# Patient Record
Sex: Female | Born: 1950 | Race: White | Hispanic: No | Smoking: Never smoker
Health system: Southern US, Community
[De-identification: ages and names within clinical notes are randomized; demographics above are authoritative.]

## PROBLEM LIST (undated history)

## (undated) DIAGNOSIS — E119 Type 2 diabetes mellitus without complications: Secondary | ICD-10-CM

## (undated) DIAGNOSIS — I10 Essential (primary) hypertension: Secondary | ICD-10-CM

## (undated) DIAGNOSIS — G35 Multiple sclerosis: Secondary | ICD-10-CM

---

## 2016-05-10 ENCOUNTER — Ambulatory Visit: Payer: Self-pay

## 2016-11-21 ENCOUNTER — Other Ambulatory Visit: Payer: Self-pay | Admitting: Physician Assistant

## 2016-11-21 DIAGNOSIS — R221 Localized swelling, mass and lump, neck: Secondary | ICD-10-CM

## 2016-11-28 ENCOUNTER — Ambulatory Visit
Admission: RE | Admit: 2016-11-28 | Discharge: 2016-11-28 | Disposition: A | Discharge status: Home / Self Care | Payer: Medicare Other | Source: Ambulatory Visit | Attending: Physician Assistant | Admitting: Physician Assistant

## 2016-11-28 DIAGNOSIS — R221 Localized swelling, mass and lump, neck: Secondary | ICD-10-CM | POA: Diagnosis not present

## 2016-11-28 HISTORY — DX: Essential (primary) hypertension: I10

## 2016-11-28 HISTORY — DX: Type 2 diabetes mellitus without complications: E11.9

## 2016-11-28 LAB — POCT I-STAT CREATININE: Creatinine, Ser: 0.8 mg/dL (ref 0.44–1.00)

## 2016-11-28 MED ORDER — IOPAMIDOL (ISOVUE-300) INJECTION 61%
75.0000 mL | Freq: Once | INTRAVENOUS | Status: AC | PRN
  Administered 2016-11-28: 75 mL via INTRAVENOUS

## 2017-05-10 ENCOUNTER — Inpatient Hospital Stay
Admission: EM | Admit: 2017-05-10 | Discharge: 2017-05-12 | DRG: 064 | Disposition: A | Discharge status: Home / Self Care | Payer: Medicare Other | Attending: Internal Medicine | Admitting: Internal Medicine

## 2017-05-10 ENCOUNTER — Emergency Department: Payer: Medicare Other

## 2017-05-10 DIAGNOSIS — Z79899 Other long term (current) drug therapy: Secondary | ICD-10-CM | POA: Diagnosis not present

## 2017-05-10 DIAGNOSIS — L8990 Pressure ulcer of unspecified site, unspecified stage: Secondary | ICD-10-CM | POA: Diagnosis present

## 2017-05-10 DIAGNOSIS — G936 Cerebral edema: Secondary | ICD-10-CM | POA: Diagnosis present

## 2017-05-10 DIAGNOSIS — Z23 Encounter for immunization: Secondary | ICD-10-CM

## 2017-05-10 DIAGNOSIS — R51 Headache: Secondary | ICD-10-CM

## 2017-05-10 DIAGNOSIS — I639 Cerebral infarction, unspecified: Secondary | ICD-10-CM

## 2017-05-10 DIAGNOSIS — R29701 NIHSS score 1: Secondary | ICD-10-CM | POA: Diagnosis present

## 2017-05-10 DIAGNOSIS — I63411 Cerebral infarction due to embolism of right middle cerebral artery: Principal | ICD-10-CM | POA: Diagnosis present

## 2017-05-10 DIAGNOSIS — Z794 Long term (current) use of insulin: Secondary | ICD-10-CM

## 2017-05-10 DIAGNOSIS — E785 Hyperlipidemia, unspecified: Secondary | ICD-10-CM | POA: Diagnosis present

## 2017-05-10 DIAGNOSIS — R519 Headache, unspecified: Secondary | ICD-10-CM

## 2017-05-10 DIAGNOSIS — I10 Essential (primary) hypertension: Secondary | ICD-10-CM | POA: Diagnosis present

## 2017-05-10 DIAGNOSIS — G35 Multiple sclerosis: Secondary | ICD-10-CM | POA: Diagnosis present

## 2017-05-10 DIAGNOSIS — E119 Type 2 diabetes mellitus without complications: Secondary | ICD-10-CM | POA: Diagnosis present

## 2017-05-10 HISTORY — DX: Multiple sclerosis: G35

## 2017-05-10 LAB — CBC WITH DIFFERENTIAL/PLATELET
BASOS ABS: 0.1 10*3/uL (ref 0–0.1)
BASOS PCT: 1 %
Eosinophils Absolute: 0.2 10*3/uL (ref 0–0.7)
Eosinophils Relative: 2 %
HEMATOCRIT: 41.2 % (ref 35.0–47.0)
HEMOGLOBIN: 14 g/dL (ref 12.0–16.0)
Lymphocytes Relative: 26 %
Lymphs Abs: 2.3 10*3/uL (ref 1.0–3.6)
MCH: 30.2 pg (ref 26.0–34.0)
MCHC: 33.9 g/dL (ref 32.0–36.0)
MCV: 89.1 fL (ref 80.0–100.0)
MONOS PCT: 7 %
Monocytes Absolute: 0.6 10*3/uL (ref 0.2–0.9)
NEUTROS ABS: 5.8 10*3/uL (ref 1.4–6.5)
NEUTROS PCT: 64 %
Platelets: 275 10*3/uL (ref 150–440)
RBC: 4.62 MIL/uL (ref 3.80–5.20)
RDW: 14 % (ref 11.5–14.5)
WBC: 8.9 10*3/uL (ref 3.6–11.0)

## 2017-05-10 LAB — BASIC METABOLIC PANEL
ANION GAP: 10 (ref 5–15)
BUN: 14 mg/dL (ref 6–20)
CALCIUM: 9.1 mg/dL (ref 8.9–10.3)
CO2: 21 mmol/L — ABNORMAL LOW (ref 22–32)
CREATININE: 0.84 mg/dL (ref 0.44–1.00)
Chloride: 99 mmol/L — ABNORMAL LOW (ref 101–111)
Glucose, Bld: 290 mg/dL — ABNORMAL HIGH (ref 65–99)
Potassium: 4.1 mmol/L (ref 3.5–5.1)
SODIUM: 130 mmol/L — AB (ref 135–145)

## 2017-05-10 LAB — APTT: APTT: 31 s (ref 24–36)

## 2017-05-10 LAB — TROPONIN I

## 2017-05-10 LAB — PROTIME-INR
INR: 1.04
PROTHROMBIN TIME: 13.5 s (ref 11.4–15.2)

## 2017-05-10 MED ORDER — ASPIRIN 81 MG PO CHEW
324.0000 mg | CHEWABLE_TABLET | Freq: Once | ORAL | Status: AC
  Administered 2017-05-10: 324 mg via ORAL
  Filled 2017-05-10: qty 4

## 2017-05-10 MED ORDER — GADOBENATE DIMEGLUMINE 529 MG/ML IV SOLN
20.0000 mL | Freq: Once | INTRAVENOUS | Status: AC | PRN
  Administered 2017-05-10: 20 mL via INTRAVENOUS

## 2017-05-10 NOTE — ED Notes (Signed)
Patient transported to CT 

## 2017-05-10 NOTE — ED Notes (Signed)
Provided crackers for pt, per EDP approval.  Pt asking to speak with EDP regarding CT results.  EDP notified and acknowledged request.

## 2017-05-10 NOTE — ED Provider Notes (Addendum)
[facility] Emergency Department Provider Note  ____________________________________________  Time seen: Approximately 2:16 PM  I have reviewed the triage vital signs and the nursing notes.   HISTORY  Chief Complaint Headache    HPI Brittany Huffman is a 66 y.o. female who complains of a sudden onset severe right-sided headache described as a tightness that is nonradiating. No aggravating or alleviating factors. He tried Tylenol which usually controls her headaches without improvement. It's been constant for the past 3 days, but actually now on arrival to the ED seems to be improving. It's associated with a generalized altered sensation that she cannot describe. She just notes that when her body touches other things it feels different. Not lateralizing. No motor weakness. No vision changes. No vomiting or syncope. No trauma or recent illness.  11 years ago she had similar symptoms and was diagnosed with multiple sclerosis. She takes daily medications to control this disease process as well for hypertension and diabetes.     Past Medical History:  Diagnosis Date  . Diabetes mellitus without complication (HCC)    Pt takes insulin.  Brittany Huffman. Hypertension   . Multiple sclerosis (HCC)      There are no active problems to display for this patient.    History reviewed. No pertinent surgical history.   Prior to Admission medications   Medication Sig Start Date End Date Taking? Authorizing Provider  amLODipine (NORVASC) 10 MG tablet Take 1 tablet by mouth daily. 05/10/17  Yes [provider]  atorvastatin (LIPITOR) 40 MG tablet Take 1 tablet by mouth daily. 05/07/17  Yes [provider]  AUBAGIO 14 MG TABS Take 1 tablet by mouth daily. 04/25/17  Yes [provider]  lisinopril (PRINIVIL,ZESTRIL) 20 MG tablet Take 1 tablet by mouth daily. 05/07/17  Yes [provider]  NOVOLOG FLEXPEN 100 UNIT/ML FlexPen Inject 0-40 Units into the  skin daily. 02/12/17  Yes [provider]  PARoxetine (PAXIL) 40 MG tablet Take 1 tablet by mouth daily. 05/07/17  Yes [provider]  TOUJEO SOLOSTAR 300 UNIT/ML SOPN Inject 50 Units into the skin 2 (two) times daily. 02/10/17  Yes [provider]  TRULICITY 1.5 MG/0.5ML SOPN Inject 0.75 mLs into the skin daily. 04/20/17  Yes [provider]  WELCHOL 625 MG tablet Take 1 tablet by mouth daily. 05/09/17  Yes [provider]  Tylene Fantasiaaubagio ampyra Lisinopril    Allergies Patient has no known allergies.   No family history on file.  Social History Social History  Substance Use Topics  . Smoking status: Not on file  . Smokeless tobacco: Not on file  . Alcohol use Not on file  No tobacco or alcohol use  Review of Systems  Constitutional:   No fever or chills.  ENT:   No sore throat. No rhinorrhea. Cardiovascular:   No chest pain or syncope. Respiratory:   No dyspnea or cough. Gastrointestinal:   Negative for abdominal pain, vomiting and diarrhea.  Musculoskeletal:   Negative for focal pain or swelling All other systems reviewed and are negative except as documented above in ROS and HPI.  ____________________________________________   PHYSICAL EXAM:  VITAL SIGNS: ED Triage Vitals [05/10/17 1217]  Enc Vitals Group     BP (!) 151/64     Pulse Rate 83     Resp 18     Temp 98.4 F (36.9 C)     Temp Source Oral     SpO2 98 %     Weight 274  lb (124.3 kg)     Height 5\' 10"  (1.778 m)     Head Circumference      Peak Flow      Pain Score 8     Pain Loc      Pain Edu?      Excl. in GC?     Vital signs reviewed, nursing assessments reviewed.   Constitutional:   Alert and oriented. Well appearing and in no distress. Eyes:   No scleral icterus.  EOMI. No nystagmus. No conjunctival pallor. PERRL. ENT   Head:   Normocephalic and atraumatic.   Nose:   No congestion/rhinnorhea.    Mouth/Throat:   MMM, no pharyngeal erythema. No  peritonsillar mass.    Neck:   No meningismus. Full ROM. Hematological/Lymphatic/Immunilogical:   No cervical lymphadenopathy. Cardiovascular:   RRR. Symmetric bilateral radial and DP pulses.  No murmurs.  Respiratory:   Normal respiratory effort without tachypnea/retractions. Breath sounds are clear and equal bilaterally. No wheezes/rales/rhonchi. Gastrointestinal:   Soft and nontender. Non distended. There is no CVA tenderness.  No rebound, rigidity, or guarding. Genitourinary:   deferred Musculoskeletal:   Normal range of motion in all extremities. No joint effusions.  No lower extremity tenderness.  No edema. Neurologic:   Normal speech and language. Subjectively impaired memory Motor grossly intact. No gross focal neurologic deficits are appreciated.  Skin:    Skin is warm, dry and intact. No rash noted.  No petechiae, purpura, or bullae.  ____________________________________________    LABS (pertinent positives/negatives) (all labs ordered are listed, but only abnormal results are displayed) Labs Reviewed  BASIC METABOLIC PANEL - Abnormal; Notable for the following:       Result Value   Sodium 130 (*)    Chloride 99 (*)    CO2 21 (*)    Glucose, Bld 290 (*)    All other components within normal limits  CBC WITH DIFFERENTIAL/PLATELET  TROPONIN I   ____________________________________________   EKG  Interpreted by me Sinus rhythm rate of 83, left axis, normal intervals. Normal QRS ST segments and T waves.  ____________________________________________    RADIOLOGY  Ct Head Wo Contrast  Result Date: 05/10/2017 CLINICAL DATA:  Three-day history of right parietal region headache EXAM: CT HEAD WITHOUT CONTRAST TECHNIQUE: Contiguous axial images were obtained from the base of the skull through the vertex without intravenous contrast. COMPARISON:  None. FINDINGS: Brain: There is mild to moderate diffuse atrophy. There is decreased attenuation in the posterosuperior right  temporal lobe near the temporal occipital junction with extension of apparent edema into the posterior right lentiform nucleus. Slightly more superiorly, there is a similar focus of decreased attenuation felt to represent edema in the anterior right parietal lobe. This appearance is somewhat unusual for infarct, and the possibility underlying mass lesions with vasogenic edema must be of concern. Elsewhere, there is slight periventricular small vessel disease in the centra semiovale bilaterally. There is no hemorrhage, midline shift, or extra-axial fluid collection. Vascular: No hyperdense vessel. There is calcification in each carotid siphon region. Skull: The bony calvarium appears intact. Sinuses/Orbits: There is mucosal thickening in several ethmoid air cells bilaterally. Other visualized paranasal sinuses are clear. There is leftward deviation the nasal septum. Orbits appear symmetric bilaterally except for prior cataract removal on the right. Other: Mastoid air cells are clear. There is debris in each external auditory canal. IMPRESSION: 1. Decreased attenuation concerning for vasogenic edema in the superior posterior right temporal lobe extending into the posterior right lentiform nucleus. A  second similar focus of concerning edema is noted in the anterior right parietal lobe. While infarct could present in this manner, the appearance raises concern for potential mass lesions with edema. In this regard, would advise brain MRI pre and post-contrast to further assess. If patient is a contraindication to MRI, contrast enhanced brain CT would be an alternative to further evaluate. 2. No hemorrhage. No midline shift or extra-axial fluid collection. There is slight small vessel disease in the centra semiovale bilaterally. 3.  There are foci of arterial vascular calcification. 4. Mucosal thickening in ethmoid air cells. Deviated nasal septum toward the left. 5.  Probable cerumen in each external auditory canal.  Electronically Signed   By: Bretta Bang III M.D.   On: 05/10/2017 13:58    ____________________________________________   PROCEDURES Procedures  ____________________________________________   DIFFERENTIAL DIAGNOSIS  Cerebral aneurysm, intracranial hemorrhage, stroke, multiple sclerosis flare  CLINICAL IMPRESSION / ASSESSMENT AND PLAN / ED COURSE  Pertinent labs & imaging results that were available during my care of the patient were reviewed by me and considered in my medical decision making (see chart for details).   Patient presents with subjective symptoms but with a reported severe right-sided headache and altered sensation which reminds her of her initial presentation of multiple sclerosis 11 years ago. She's been compliant with her medications and due to lack of relapsing events has not had any recent neuro imaging in the past few years. We'll plan to obtain a CT scan of the head to evaluate for intracranial hemorrhage, followed up by MRI brain if the CT is negative. Patient understands and agrees with this plan.  Clinical Course as of May 10 1536  Fri May 10, 2017  1414 CT head not c/w ICH, but shows vasogenic edema. Will proceed with MRI for eval of MS vs mass lesion vs CVA. Labs unremarkable.   [PS]    Clinical Course User Index [PS] Sharman Cheek, MD    ----------------------------------------- 3:37 PM on 05/10/2017 -----------------------------------------  Patient updated on results of the CT. Care of patient signed out to Dr. Derrill Kay.  ____________________________________________   FINAL CLINICAL IMPRESSION(S) / ED DIAGNOSES    Final diagnoses:  Bad headache  Multiple sclerosis (HCC)  Vasogenic edema (HCC)      New Prescriptions   No medications on file     Portions of this note were generated with dragon dictation software. Dictation errors may occur despite best attempts at proofreading.    Sharman Cheek, MD 05/10/17 1420     Sharman Cheek, MD 05/10/17 1537

## 2017-05-10 NOTE — ED Triage Notes (Signed)
Pt c/o headache X 3 days, unable to get relief with tylenol. Pt also c/o left shoulder pain without injury. Pt alert and oriented X4, active, cooperative, pt in NAD. RR even and unlabored, color WNL.

## 2017-05-10 NOTE — ED Notes (Signed)
Pt on phone with MRI staff.

## 2017-05-10 NOTE — ED Notes (Signed)
Patient transported to MRI.AS 

## 2017-05-10 NOTE — H&P (Signed)
[facility] - [place] [facility]   PATIENT NAME: Brittany Huffman    MR#:  [id number]  DATE OF BIRTH:  1950-07-28  DATE OF ADMISSION:  05/10/2017  PRIMARY CARE PHYSICIAN: System, Pcp Not In   REQUESTING/REFERRING PHYSICIAN: Derrill Kay, MD  CHIEF COMPLAINT:   Chief Complaint  Patient presents with  . Headache    HISTORY OF PRESENT ILLNESS:  Brittany Huffman  is a 66 y.o. female who presents with 2-3 days of intermittent but increasing in intensity headaches.  She states that today she developed severe headache, and then also experienced some sensory abnormality.  She describes this as difficulty recognizing her perception of how objects felt.  She denies any out right numbness, but just states that "objects did not feel like they felt before."  Here in the ED MRI was performed and showed multifocal infarcts suspicious for embolic etiology.  Patient has no history of A. fib or other significant arrhythmia.  Hospitalist were called for admission and further evaluation  PAST MEDICAL HISTORY:   Past Medical History:  Diagnosis Date  . Diabetes mellitus without complication (HCC)    Pt takes insulin.  [name] Hypertension   . Multiple sclerosis (HCC)     PAST SURGICAL HISTORY:  History reviewed. No pertinent surgical history.  SOCIAL HISTORY:   Social History  Substance Use Topics  . Smoking status: Not on file  . Smokeless tobacco: Not on file  . Alcohol use Not on file    FAMILY HISTORY:  No family history on file.  DRUG ALLERGIES:  No Known Allergies  MEDICATIONS AT HOME:   Prior to Admission medications   Medication Sig Start Date End Date Taking? Authorizing Provider  amLODipine (NORVASC) 10 MG tablet Take 1 tablet by mouth daily. 05/10/17  Yes [provider]  atorvastatin (LIPITOR) 40 MG tablet Take 1 tablet by mouth daily. 05/07/17  Yes [provider]  AUBAGIO 14 MG TABS Take 1 tablet by mouth daily. 04/25/17  Yes [provider]  lisinopril (PRINIVIL,ZESTRIL) 20 MG tablet Take 1 tablet by mouth daily. 05/07/17  Yes [provider]  NOVOLOG FLEXPEN 100 UNIT/ML FlexPen Inject 0-40 Units into the skin daily. 02/12/17  Yes [provider]  PARoxetine (PAXIL) 40 MG tablet Take 1 tablet by mouth daily. 05/07/17  Yes [provider]  TOUJEO SOLOSTAR 300 UNIT/ML SOPN Inject 50 Units into the skin 2 (two) times daily. 02/10/17  Yes [provider]  TRULICITY 1.5 MG/0.5ML SOPN Inject 0.75 mLs into the skin daily. 04/20/17  Yes [provider]  WELCHOL 625 MG tablet Take 1 tablet by mouth daily. 05/09/17  Yes [provider]    REVIEW OF SYSTEMS:  Review of Systems  Constitutional: Negative for chills, fever, malaise/fatigue and weight loss.  HENT: Negative for ear pain, hearing loss and tinnitus.   Eyes: Negative for blurred vision, double vision, pain and redness.  Respiratory: Negative for cough, hemoptysis and shortness of breath.   Cardiovascular: Negative for chest pain, palpitations, orthopnea and leg swelling.  Gastrointestinal: Negative for abdominal pain, constipation, diarrhea, nausea and vomiting.  Genitourinary: Negative for dysuria, frequency and hematuria.  Musculoskeletal: Negative for back pain, joint pain and neck pain.  Skin:       No acne, rash, or lesions  Neurological: Positive for sensory change and headaches. Negative for dizziness, tremors, focal weakness and weakness.  Endo/Heme/Allergies: Negative for polydipsia. Does not bruise/bleed easily.  Psychiatric/Behavioral: Negative for depression. The patient is not  nervous/anxious and does not have insomnia.      VITAL SIGNS:   Vitals:   05/10/17 1830 05/10/17 1900 05/10/17 2100 05/10/17 2130  BP: (!) 126/54 139/70 127/63 134/63  Pulse: (!) 104 82    Resp: 14 16 17 19   Temp:      TempSrc:      SpO2: 96% 94%    Weight:      Height:       Wt Readings from Last 3 Encounters:   05/10/17 124.3 kg (274 lb)    PHYSICAL EXAMINATION:  Physical Exam  Vitals reviewed. Constitutional: She is oriented to person, place, and time. She appears well-developed and well-nourished. No distress.  HENT:  Head: Normocephalic and atraumatic.  Mouth/Throat: Oropharynx is clear and moist.  Eyes: Pupils are equal, round, and reactive to light. Conjunctivae and EOM are normal. No scleral icterus.  Neck: Normal range of motion. Neck supple. No JVD present. No thyromegaly present.  Cardiovascular: Normal rate, regular rhythm and intact distal pulses.  Exam reveals no gallop and no friction rub.   No murmur heard. Respiratory: Effort normal and breath sounds normal. No respiratory distress. She has no wheezes. She has no rales.  GI: Soft. Bowel sounds are normal. She exhibits no distension. There is no tenderness.  Musculoskeletal: Normal range of motion. She exhibits no edema.  No arthritis, no gout  Lymphadenopathy:    She has no cervical adenopathy.  Neurological: She is alert and oriented to person, place, and time. No cranial nerve deficit.  Neurologic: Cranial nerves II-XII intact, Sensation intact to light touch/pinprick, 5/5 strength in all extremities, no dysarthria, no aphasia, no dysphagia, memory intact, patient still has mild reported abnormality of abnormal perception of sensation (states that "things just do not feel like what they felt like before, like I have a hard time recognizing how objects felt before")  Skin: Skin is warm and dry. No rash noted. No erythema.  Psychiatric: She has a normal mood and affect. Her behavior is normal. Judgment and thought content normal.    LABORATORY PANEL:   CBC  Recent Labs Lab 05/10/17 1234  WBC 8.9  HGB 14.0  HCT 41.2  PLT 275   ------------------------------------------------------------------------------------------------------------------  Chemistries   Recent Labs Lab 05/10/17 1234  NA 130*  K 4.1  CL 99*   CO2 21*  GLUCOSE 290*  BUN 14  CREATININE 0.84  CALCIUM 9.1   ------------------------------------------------------------------------------------------------------------------  Cardiac Enzymes  Recent Labs Lab 05/10/17 1234  TROPONINI <0.03   ------------------------------------------------------------------------------------------------------------------  RADIOLOGY:  Ct Head Wo Contrast  Result Date: 05/10/2017 CLINICAL DATA:  Three-day history of right parietal region headache EXAM: CT HEAD WITHOUT CONTRAST TECHNIQUE: Contiguous axial images were obtained from the base of the skull through the vertex without intravenous contrast. COMPARISON:  None. FINDINGS: Brain: There is mild to moderate diffuse atrophy. There is decreased attenuation in the posterosuperior right temporal lobe near the temporal occipital junction with extension of apparent edema into the posterior right lentiform nucleus. Slightly more superiorly, there is a similar focus of decreased attenuation felt to represent edema in the anterior right parietal lobe. This appearance is somewhat unusual for infarct, and the possibility underlying mass lesions with vasogenic edema must be of concern. Elsewhere, there is slight periventricular small vessel disease in the centra semiovale bilaterally. There is no hemorrhage, midline shift, or extra-axial fluid collection. Vascular: No hyperdense vessel. There is calcification in each carotid siphon region. Skull: The bony calvarium appears intact. Sinuses/Orbits: There is mucosal  thickening in several ethmoid air cells bilaterally. Other visualized paranasal sinuses are clear. There is leftward deviation the nasal septum. Orbits appear symmetric bilaterally except for prior cataract removal on the right. Other: Mastoid air cells are clear. There is debris in each external auditory canal. IMPRESSION: 1. Decreased attenuation concerning for vasogenic edema in the superior posterior right  temporal lobe extending into the posterior right lentiform nucleus. A second similar focus of concerning edema is noted in the anterior right parietal lobe. While infarct could present in this manner, the appearance raises concern for potential mass lesions with edema. In this regard, would advise brain MRI pre and post-contrast to further assess. If patient is a contraindication to MRI, contrast enhanced brain CT would be an alternative to further evaluate. 2. No hemorrhage. No midline shift or extra-axial fluid collection. There is slight small vessel disease in the centra semiovale bilaterally. 3.  There are foci of arterial vascular calcification. 4. Mucosal thickening in ethmoid air cells. Deviated nasal septum toward the left. 5.  Probable cerumen in each external auditory canal. Electronically Signed   By: Bretta Bang III M.D.   On: 05/10/2017 13:58   Mr Angiogram Head Wo Contrast  Result Date: 05/10/2017 CLINICAL DATA:  Initial evaluation for sudden onset having, history of multiple sclerosis. EXAM: MRI HEAD WITHOUT AND WITH CONTRAST MRA HEAD WITHOUT CONTRAST TECHNIQUE: Multiplanar, multiecho pulse sequences of the brain and surrounding structures were obtained without and with intravenous contrast. Angiographic images of the head were obtained using MRA technique without contrast. CONTRAST:  65mL MULTIHANCE GADOBENATE DIMEGLUMINE 529 MG/ML IV SOLN COMPARISON:  Prior CT from earlier the same day. FINDINGS: MRI HEAD FINDINGS Brain: Moderate cerebral atrophy. Patchy T2/FLAIR hyperintensity within the periventricular and deep white matter both cerebral hemispheres, nonspecific, but could be related to provided history of multiple sclerosis. Few small remote left cerebellar infarcts noted. The Patchy and confluent restricted diffusion seen involving the posterior right insula, extending into the posterior right frontoparietal region as well as the right occipital lobe, consistent with acute right  posterior MCA territory infarct. Involvement of the cortical gray matter as well as the underlying subcortical, deep, and periventricular white matter. Overall infarct volume is moderate in size. No associated hemorrhage or significant mass effect. Additional patchy small volume ischemic infarcts measuring approximately 15 mm present within the superior left cerebellar hemisphere (series 100, image 15). No associated hemorrhage or mass effect. No other convincing evidence for acute or subacute ischemia. Gray-white matter differentiation otherwise maintained. No evidence for acute or chronic intracranial hemorrhage. No mass lesion, midline shift or mass effect. Mild leptomeningeal enhancement related to the posterior right MCA territory infarct. No other abnormal enhancement. No findings to suggest active demyelination. No hydrocephalus. No extra-axial fluid collection. Major dural sinuses grossly patent. Incidental note made of a partially empty sella. Midline structures intact and normal. Vascular: Major intracranial vascular flow voids are maintained. Skull and upper cervical spine: Craniocervical junction normal. Upper cervical spine within normal limits. Bone marrow signal intensity normal. No scalp soft tissue abnormality. Sinuses/Orbits: Globes and orbital soft tissues within normal limits. Patient status post cataract extraction on the right. Other: Paranasal sinuses are largely clear. No significant mastoid effusion. MRA HEAD FINDINGS ANTERIOR CIRCULATION: Distal cervical segments patent of the skullbase with antegrade flow. Petrous, cavernous, and supraclinoid segments patent without flow-limiting stenosis. A1 segments patent bilaterally. Left A1 segment slightly hypoplastic, which likely accounts for the slightly diminutive left ICA is compared to the right. A1 segments and anterior  communicating artery patent and within normal limits. ACA see irregular bilaterally with moderate multifocal stenoses,  greater on the right. ACAs are patent to their distal aspects. M1 segments patent bilaterally without stenosis. Normal MCA bifurcations. Moderate multifocal atheromatous regularity with stenoses involving the right M2 segments and distal right MCA branches. No proximal M2 stenosis or occlusion seen on the left. MCA branches well perfused and fairly symmetric. POSTERIOR CIRCULATION: Vertebral arteries patent to the vertebrobasilar junction without stenosis. Left PICA patent proximally. Right PICA not visualized. Basilar artery patent to its distal aspect without stenosis. Patent ICAs. Both of the posterior cerebral arteries supplied primarily via the basilar. Small posterior communicating arteries noted bilaterally. Right PCA patent to its distal aspect without flow-limiting stenosis. Distal left P3 segment and distal small branches are attenuated, possibly reflecting severe stenosis. No aneurysm. IMPRESSION: MRI HEAD IMPRESSION: 1. Patchy multifocal moderate sized posterior right MCA territory infarcts, with additional small superior left cerebellar infarct. No associated hemorrhage or mass effect. Possible central thromboembolic etiology is suspected given the various vascular distributions involved. The 2. Underlying mild cerebral white matter changes, nonspecific, but could be consistent with provided history of multiple sclerosis. No evidence for active demyelination. 3. Mild age-related cerebral atrophy. No other acute intracranial abnormality. MRA HEAD IMPRESSION: 1. Negative intracranial MRA for large or proximal arterial branch occlusion. No correctable stenosis. 2. Moderate atheromatous irregularity involving the intracranial circulation, most notably within the anterior cerebral arteries, distal right MCA branches, and distal left PCA. Electronically Signed   By: Rise MuBenjamin  McClintock M.D.   On: 05/10/2017 21:06   Mr Brain W And Wo Contrast  Result Date: 05/10/2017 CLINICAL DATA:  Initial evaluation for  sudden onset having, history of multiple sclerosis. EXAM: MRI HEAD WITHOUT AND WITH CONTRAST MRA HEAD WITHOUT CONTRAST TECHNIQUE: Multiplanar, multiecho pulse sequences of the brain and surrounding structures were obtained without and with intravenous contrast. Angiographic images of the head were obtained using MRA technique without contrast. CONTRAST:  20mL MULTIHANCE GADOBENATE DIMEGLUMINE 529 MG/ML IV SOLN COMPARISON:  Prior CT from earlier the same day. FINDINGS: MRI HEAD FINDINGS Brain: Moderate cerebral atrophy. Patchy T2/FLAIR hyperintensity within the periventricular and deep white matter both cerebral hemispheres, nonspecific, but could be related to provided history of multiple sclerosis. Few small remote left cerebellar infarcts noted. The Patchy and confluent restricted diffusion seen involving the posterior right insula, extending into the posterior right frontoparietal region as well as the right occipital lobe, consistent with acute right posterior MCA territory infarct. Involvement of the cortical gray matter as well as the underlying subcortical, deep, and periventricular white matter. Overall infarct volume is moderate in size. No associated hemorrhage or significant mass effect. Additional patchy small volume ischemic infarcts measuring approximately 15 mm present within the superior left cerebellar hemisphere (series 100, image 15). No associated hemorrhage or mass effect. No other convincing evidence for acute or subacute ischemia. Gray-white matter differentiation otherwise maintained. No evidence for acute or chronic intracranial hemorrhage. No mass lesion, midline shift or mass effect. Mild leptomeningeal enhancement related to the posterior right MCA territory infarct. No other abnormal enhancement. No findings to suggest active demyelination. No hydrocephalus. No extra-axial fluid collection. Major dural sinuses grossly patent. Incidental note made of a partially empty sella. Midline  structures intact and normal. Vascular: Major intracranial vascular flow voids are maintained. Skull and upper cervical spine: Craniocervical junction normal. Upper cervical spine within normal limits. Bone marrow signal intensity normal. No scalp soft tissue abnormality. Sinuses/Orbits: Globes and orbital soft tissues  within normal limits. Patient status post cataract extraction on the right. Other: Paranasal sinuses are largely clear. No significant mastoid effusion. MRA HEAD FINDINGS ANTERIOR CIRCULATION: Distal cervical segments patent of the skullbase with antegrade flow. Petrous, cavernous, and supraclinoid segments patent without flow-limiting stenosis. A1 segments patent bilaterally. Left A1 segment slightly hypoplastic, which likely accounts for the slightly diminutive left ICA is compared to the right. A1 segments and anterior communicating artery patent and within normal limits. ACA see irregular bilaterally with moderate multifocal stenoses, greater on the right. ACAs are patent to their distal aspects. M1 segments patent bilaterally without stenosis. Normal MCA bifurcations. Moderate multifocal atheromatous regularity with stenoses involving the right M2 segments and distal right MCA branches. No proximal M2 stenosis or occlusion seen on the left. MCA branches well perfused and fairly symmetric. POSTERIOR CIRCULATION: Vertebral arteries patent to the vertebrobasilar junction without stenosis. Left PICA patent proximally. Right PICA not visualized. Basilar artery patent to its distal aspect without stenosis. Patent ICAs. Both of the posterior cerebral arteries supplied primarily via the basilar. Small posterior communicating arteries noted bilaterally. Right PCA patent to its distal aspect without flow-limiting stenosis. Distal left P3 segment and distal small branches are attenuated, possibly reflecting severe stenosis. No aneurysm. IMPRESSION: MRI HEAD IMPRESSION: 1. Patchy multifocal moderate sized  posterior right MCA territory infarcts, with additional small superior left cerebellar infarct. No associated hemorrhage or mass effect. Possible central thromboembolic etiology is suspected given the various vascular distributions involved. The 2. Underlying mild cerebral white matter changes, nonspecific, but could be consistent with provided history of multiple sclerosis. No evidence for active demyelination. 3. Mild age-related cerebral atrophy. No other acute intracranial abnormality. MRA HEAD IMPRESSION: 1. Negative intracranial MRA for large or proximal arterial branch occlusion. No correctable stenosis. 2. Moderate atheromatous irregularity involving the intracranial circulation, most notably within the anterior cerebral arteries, distal right MCA branches, and distal left PCA. Electronically Signed   By: Rise Mu M.D.   On: 05/10/2017 21:06    EKG:   Orders placed or performed during the hospital encounter of 05/10/17  . ED EKG  . ED EKG    IMPRESSION AND PLAN:  Principal Problem:   Stroke [facility]) -admit per stroke admission order set with appropriate imaging, labs and consults including neurology consult.  Patient has an embolic source somewhere, echocardiogram will hopefully elucidate any potential cardiac source of emboli. Active Problems:   MS (multiple sclerosis) (HCC) -MRI did not indicate any worsening of her multiple sclerosis, continue home meds for this   HTN (hypertension) -permissive hypertension for the first 24 hours after onset of symptoms, blood pressure goal less than 220/120   Diabetes (HCC) -sliding scale insulin with corresponding glucose checks  All the records are reviewed and case discussed with ED provider. Management plans discussed with the patient and/or family.  DVT PROPHYLAXIS: SubQ lovenox  GI PROPHYLAXIS: None  ADMISSION STATUS: Inpatient  CODE STATUS: Full Code Status History    This patient does not have a recorded code status. Please  follow your organizational policy for patients in this situation.      TOTAL TIME TAKING CARE OF THIS PATIENT: 45 minutes.   Bodee Lafoe FIELDING 05/10/2017, 11:52 PM  [facility]  Office  [phone]  CC: Primary care physician; System, Pcp Not In  Note:  This document was prepared using Dragon voice recognition software and may include unintentional dictation errors.

## 2017-05-11 ENCOUNTER — Inpatient Hospital Stay: Payer: Medicare Other

## 2017-05-11 ENCOUNTER — Inpatient Hospital Stay
Admission: EM | Admit: 2017-05-11 | Discharge: 2017-05-11 | Disposition: A | Discharge status: Home / Self Care | Payer: Medicare Other | Source: Home / Self Care | Attending: Internal Medicine | Admitting: Internal Medicine

## 2017-05-11 DIAGNOSIS — I639 Cerebral infarction, unspecified: Secondary | ICD-10-CM

## 2017-05-11 DIAGNOSIS — L8990 Pressure ulcer of unspecified site, unspecified stage: Secondary | ICD-10-CM | POA: Insufficient documentation

## 2017-05-11 LAB — LIPID PANEL
Cholesterol: 129 mg/dL (ref 0–200)
HDL: 37 mg/dL — AB (ref >40)
LDL Cholesterol: 70 mg/dL (ref 0–99)
TRIGLYCERIDES: 110 mg/dL (ref <150)
Total CHOL/HDL Ratio: 3.5 RATIO
VLDL: 22 mg/dL (ref 0–40)

## 2017-05-11 LAB — GLUCOSE, CAPILLARY
Glucose-Capillary: 258 mg/dL — ABNORMAL HIGH (ref 65–99)
Glucose-Capillary: 383 mg/dL — ABNORMAL HIGH (ref 65–99)
Glucose-Capillary: 328 mg/dL — ABNORMAL HIGH (ref 65–99)
Glucose-Capillary: 364 mg/dL — ABNORMAL HIGH (ref 65–99)

## 2017-05-11 LAB — HEMOGLOBIN A1C
HEMOGLOBIN A1C: 8.3 % — AB (ref 4.8–5.6)
MEAN PLASMA GLUCOSE: 191.51 mg/dL

## 2017-05-11 MED ORDER — ATORVASTATIN CALCIUM 20 MG PO TABS
40.0000 mg | ORAL_TABLET | Freq: Every day | ORAL | Status: DC
  Administered 2017-05-11 – 2017-05-12 (×2): 40 mg via ORAL
  Filled 2017-05-11 (×2): qty 2

## 2017-05-11 MED ORDER — INSULIN ASPART 100 UNIT/ML ~~LOC~~ SOLN
0.0000 [IU] | Freq: Three times a day (TID) | SUBCUTANEOUS | Status: DC
  Administered 2017-05-11: 18:00:00 7 [IU] via SUBCUTANEOUS
  Administered 2017-05-11: 09:00:00 5 [IU] via SUBCUTANEOUS
  Administered 2017-05-11: 9 [IU] via SUBCUTANEOUS
  Administered 2017-05-12 (×2): 7 [IU] via SUBCUTANEOUS
  Filled 2017-05-11 (×5): qty 1

## 2017-05-11 MED ORDER — ASPIRIN EC 81 MG PO TBEC
81.0000 mg | DELAYED_RELEASE_TABLET | Freq: Every day | ORAL | Status: DC
  Administered 2017-05-11 – 2017-05-12 (×2): 81 mg via ORAL
  Filled 2017-05-11 (×2): qty 1

## 2017-05-11 MED ORDER — INSULIN ASPART 100 UNIT/ML ~~LOC~~ SOLN
0.0000 [IU] | Freq: Every day | SUBCUTANEOUS | Status: DC
  Administered 2017-05-11: 5 [IU] via SUBCUTANEOUS
  Filled 2017-05-11: qty 1

## 2017-05-11 MED ORDER — METOPROLOL TARTRATE 25 MG PO TABS
12.5000 mg | ORAL_TABLET | Freq: Two times a day (BID) | ORAL | Status: DC
  Administered 2017-05-11 – 2017-05-12 (×3): 12.5 mg via ORAL
  Filled 2017-05-11 (×3): qty 1

## 2017-05-11 MED ORDER — PNEUMOCOCCAL VAC POLYVALENT 25 MCG/0.5ML IJ INJ
0.5000 mL | INJECTION | INTRAMUSCULAR | Status: AC
  Administered 2017-05-12: 10:00:00 0.5 mL via INTRAMUSCULAR
  Filled 2017-05-11: qty 0.5

## 2017-05-11 MED ORDER — PAROXETINE HCL 20 MG PO TABS
40.0000 mg | ORAL_TABLET | Freq: Every day | ORAL | Status: DC
  Administered 2017-05-11 – 2017-05-12 (×2): 40 mg via ORAL
  Filled 2017-05-11 (×2): qty 2

## 2017-05-11 MED ORDER — ACETAMINOPHEN 650 MG RE SUPP: 650.0000 mg | RECTAL | Status: DC | PRN

## 2017-05-11 MED ORDER — TERIFLUNOMIDE 14 MG PO TABS: 1.0000 | ORAL_TABLET | Freq: Every day | ORAL | Status: DC

## 2017-05-11 MED ORDER — ACETAMINOPHEN 325 MG PO TABS: 650.0000 mg | ORAL_TABLET | ORAL | Status: DC | PRN

## 2017-05-11 MED ORDER — ACETAMINOPHEN 160 MG/5ML PO SOLN: 650.0000 mg | ORAL | Status: DC | PRN

## 2017-05-11 MED ORDER — PERFLUTREN LIPID MICROSPHERE
1.0000 mL | INTRAVENOUS | Status: AC | PRN
  Administered 2017-05-11: 2 mL via INTRAVENOUS
  Filled 2017-05-11: qty 10

## 2017-05-11 MED ORDER — STROKE: EARLY STAGES OF RECOVERY BOOK
Freq: Once | Status: AC
  Administered 2017-05-11: 03:00:00

## 2017-05-11 MED ORDER — ENOXAPARIN SODIUM 40 MG/0.4ML ~~LOC~~ SOLN
40.0000 mg | SUBCUTANEOUS | Status: DC
  Administered 2017-05-11: 40 mg via SUBCUTANEOUS
  Filled 2017-05-11: qty 0.4

## 2017-05-11 NOTE — Progress Notes (Signed)
When completing last neuro check, patient reported having visual hallucinations this afternoon and impaired peripheral vision. On-call MD Imogene Burn) paged and notified. No new orders. Will continue to monitor closely. Bo Mcclintock, RN

## 2017-05-11 NOTE — Evaluation (Signed)
Occupational Therapy Evaluation Patient Details Name: Kandice Moosrlene Tamburro MRN: [id number] DOB: 1950-11-15 Today's Date: 05/11/2017    History of Present Illness Terryann Oatley is a 66yo white female who comes to [facility] after 2-3 days of progressively more intense intermittent HA. Pt also reported changes to her haptic perception. PMH: DM, MS, HTN. At baseline, pt lives alone, performs mostly household distance AMB, limited chronic left sided pain. She is fully independent in IADL, [place] public transit [place] access [place] and [place].    Clinical Impression   OT evaluation completed this date. Pt was modified independent with mobility at baseline for community distances from w/c level, using public transportation, and indep with basic ADL tasks. Pt presents grossly at baseline with no sensation/strength/vision/mobility/ADL deficits noted. Pt educated in home/routine modifications to maximize safety and basic energy conservation principles to minimize falls risk; pt verbalized understanding. No additional skilled OT needs at this time. Will sign off.    Follow Up Recommendations  No OT follow up    Equipment Recommendations  Toilet rise with handles;Other (comment) (reacher, LH sponge, HH shower head)    Recommendations for Other Services       Precautions / Restrictions Precautions Precautions: None Restrictions Weight Bearing Restrictions: No      Mobility Bed Mobility Overal bed mobility: Independent                Transfers Overall transfer level: Independent                    Balance Overall balance assessment: No apparent balance deficits (not formally assessed)                                         ADL either performed or assessed with clinical judgement   ADL Overall ADL's : Modified independent                                       General ADL Comments: generally able to perform ADL at baseline  independence, educated in home/routine modifications to maximize safety and basic energy conservation principles to minimize falls risk     Vision Baseline Vision/History: Wears glasses Wears Glasses: Reading only Patient Visual Report: No change from baseline Vision Assessment?: No apparent visual deficits     Perception     Praxis      Pertinent Vitals/Pain Pain Assessment: No/denies pain     Hand Dominance     Extremity/Trunk Assessment Upper Extremity Assessment Upper Extremity Assessment: Overall WFL for tasks assessed   Lower Extremity Assessment Lower Extremity Assessment: Defer to PT evaluation;Generalized weakness   Cervical / Trunk Assessment Cervical / Trunk Assessment: Normal   Communication Communication Communication: No difficulties   Cognition Arousal/Alertness: Awake/alert Behavior During Therapy: WFL for tasks assessed/performed Overall Cognitive Status: Within Functional Limits for tasks assessed                                     General Comments       Exercises     Shoulder Instructions      Home Living Family/patient expects to be discharged to:: Private residence Living Arrangements: Alone   Type of Home: Apartment Home Access: Level entry  Home Layout: One level     Bathroom Shower/Tub: Producer, television/film/video: Standard (BSC frame overtop)     Home Equipment: Walker - 4 wheels;Wheelchair - manual;Shower seat - built in;Grab bars - tub/shower          Prior Functioning/Environment Level of Independence: Independent with assistive device(s)        Comments: modified indep with IADL at w/c level, uses public transportation, no falls in past 12 months        OT Problem List:        OT Treatment/Interventions:      OT Goals(Current goals can be found in the care plan section) Acute Rehab OT Goals Patient Stated Goal: go home OT Goal Formulation: All assessment and education complete, DC  therapy  OT Frequency:     Barriers to D/C:            Co-evaluation              AM-PAC PT "6 Clicks" Daily Activity     Outcome Measure Help from another person eating meals?: None Help from another person taking care of personal grooming?: None Help from another person toileting, which includes using toliet, bedpan, or urinal?: None Help from another person bathing (including washing, rinsing, drying)?: A Little Help from another person to put on and taking off regular upper body clothing?: None Help from another person to put on and taking off regular lower body clothing?: None 6 Click Score: 23   End of Session    Activity Tolerance: Patient tolerated treatment well Patient left: in bed;with call bell/phone within reach  OT Visit Diagnosis: Other abnormalities of gait and mobility (R26.89)                Time: 7782-4235 OT Time Calculation (min): 11 min Charges:  OT General Charges $OT Visit: 1 Visit OT Evaluation $OT Eval Low Complexity: 1 Low G-Codes: OT G-codes **NOT FOR INPATIENT CLASS** Functional Assessment Tool Used: Clinical judgement;AM-PAC 6 Clicks Daily Activity Functional Limitation: Self care Self Care Current Status (T6144): At least 1 percent but less than 20 percent impaired, limited or restricted Self Care Goal Status (R1540): At least 1 percent but less than 20 percent impaired, limited or restricted Self Care Discharge Status [phone]): At least 1 percent but less than 20 percent impaired, limited or restricted   Richrd Prime, MPH, MS, OTR/L ascom [phone] 05/11/17, 4:17 PM

## 2017-05-11 NOTE — Progress Notes (Signed)
SLP Cancellation Note  Patient Details Name: Brittany Huffman MRN: [id number] DOB: 1950-12-22   Cancelled treatment:       Reason Eval/Treat Not Completed: SLP screened, no needs identified, will sign off (chart reviewed; nsg consulted; met w/ pt).  Pt denied any difficulty swallowing and is currently on a regular diet; tolerates swallowing pills w/ water per NSG. Pt conversed at conversational level w/out deficits noted; pt and family denied any speech-language deficits.  No further skilled ST services indicated as pt appears at her baseline. Pt agreed. NSG to reconsult if any change in status.     Orinda Kenner, MS, CCC-SLP [place]re Beaver 05/11/2017, 2:13 PM

## 2017-05-11 NOTE — Consult Note (Signed)
Referring Physician: Madelon Lips    Chief Complaint: Headache  HPI: Brittany Huffman is an 66 y.o. female with a history of multiple sclerosis who reports that for the past 3 days prior to admission she had had a severe right-sided headache.  This was not her typical headache.  Also reports that on the day of admission she awakened and felt that everything she was touching did not feel right.  This was not unilateral and was noted with her left and right.  Due to these changes the patient presented for evaluation.  Initial NIH stroke scale of 1.  Date last known well: Unable to determine Time last known well: Unable to determine tPA Given: No: Unable to determine LKW  Past Medical History:  Diagnosis Date  . Diabetes mellitus without complication (HCC)    Pt takes insulin.  [name] Hypertension   . Multiple sclerosis (HCC)     History reviewed. No pertinent surgical history.  No family history on file. Social History:  reports that she has never smoked. She has never used smokeless tobacco. She reports that she does not drink alcohol or use drugs.  Allergies: No Known Allergies  Medications:  I have reviewed the patient's current medications. Prior to Admission:  Prescriptions Prior to Admission  Medication Sig Dispense Refill Last Dose  . amLODipine (NORVASC) 10 MG tablet Take 1 tablet by mouth daily.   05/09/2017 at Unknown time  . atorvastatin (LIPITOR) 40 MG tablet Take 1 tablet by mouth daily.   05/09/2017 at Unknown time  . AUBAGIO 14 MG TABS Take 1 tablet by mouth daily.   05/09/2017 at Unknown time  . lisinopril (PRINIVIL,ZESTRIL) 20 MG tablet Take 1 tablet by mouth daily.   05/09/2017 at Unknown time  . NOVOLOG FLEXPEN 100 UNIT/ML FlexPen Inject 0-40 Units into the skin daily.  1 05/09/2017 at Unknown time  . PARoxetine (PAXIL) 40 MG tablet Take 1 tablet by mouth daily.   05/09/2017 at Unknown time  . TOUJEO SOLOSTAR 300 UNIT/ML SOPN Inject 50 Units into the skin 2 (two) times daily.  1  05/09/2017 at Unknown time  . TRULICITY 1.5 MG/0.5ML SOPN Inject 0.75 mLs into the skin daily.  2 05/09/2017 at Unknown time  . WELCHOL 625 MG tablet Take 1 tablet by mouth daily.   05/09/2017 at Unknown time   Scheduled: . aspirin EC  81 mg Oral Daily  . atorvastatin  40 mg Oral Daily  . enoxaparin (LOVENOX) injection  40 mg Subcutaneous Q24H  . insulin aspart  0-5 Units Subcutaneous QHS  . insulin aspart  0-9 Units Subcutaneous TID WC  . metoprolol tartrate  12.5 mg Oral BID  . PARoxetine  40 mg Oral Daily  . [START ON 05/12/2017] pneumococcal 23 valent vaccine  0.5 mL Intramuscular Tomorrow-1000  . Teriflunomide  1 tablet Oral Daily    ROS: History obtained from the patient  General ROS: negative for - chills, fatigue, fever, night sweats, weight gain or weight loss Psychological ROS: negative for - behavioral disorder, hallucinations, memory difficulties, mood swings or suicidal ideation Ophthalmic ROS: negative for - blurry vision, double vision, eye pain or loss of vision ENT ROS: negative for - epistaxis, nasal discharge, oral lesions, sore throat, tinnitus or vertigo Allergy and Immunology ROS: negative for - hives or itchy/watery eyes Hematological and Lymphatic ROS: negative for - bleeding problems, bruising or swollen lymph nodes Endocrine ROS: negative for - galactorrhea, hair pattern changes, polydipsia/polyuria or temperature intolerance Respiratory ROS: negative for - cough,  hemoptysis, shortness of breath or wheezing Cardiovascular ROS: negative for - chest pain, dyspnea on exertion, edema or irregular heartbeat Gastrointestinal ROS: negative for - abdominal pain, diarrhea, hematemesis, nausea/vomiting or stool incontinence Genito-Urinary ROS: negative for - dysuria, hematuria, incontinence or urinary frequency/urgency Musculoskeletal ROS: negative for - joint swelling or muscular weakness Neurological ROS: as noted in HPI, balance problems Dermatological ROS: negative for  rash and skin lesion changes  Physical Examination: Blood pressure 136/68, pulse (!) 109, temperature 98.4 F (36.9 C), temperature source Oral, resp. rate 20, height 5\' 10"  (1.778 m), weight 123.4 kg (272 lb), SpO2 98 %.  HEENT-  Normocephalic, no lesions, without obvious abnormality.  Normal external eye and conjunctiva.  Normal TM's bilaterally.  Normal auditory canals and external ears. Normal external nose, mucus membranes and septum.  Normal pharynx. Cardiovascular- S1, S2 normal, pulses palpable throughout   Lungs- chest clear, no wheezing, rales, normal symmetric air entry Abdomen- soft, non-tender; bowel sounds normal; no masses,  no organomegaly Extremities- mild LE edema Lymph-no adenopathy palpable Musculoskeletal-no joint tenderness, deformity or swelling Skin-warm and dry, no hyperpigmentation, vitiligo, or suspicious lesions  Neurological Examination   Mental Status: Alert, oriented, thought content appropriate.  Speech fluent without evidence of aphasia.  Able to follow 3 step commands without difficulty. Cranial Nerves: II: Discs flat bilaterally; Visual fields grossly normal, pupils equal, round, reactive to light and accommodation III,IV, VI: ptosis not present, extra-ocular motions intact bilaterally V,VII: smile symmetric, facial light touch sensation normal bilaterally VIII: hearing normal bilaterally IX,X: gag reflex present XI: bilateral shoulder shrug XII: midline tongue extension Motor: Right : Upper extremity   5/5 with 5-/5 hand grip    Left:     Upper extremity   5/5  Lower extremity   4-/5        Lower extremity   4-/5 Tone and bulk:normal tone throughout; no atrophy noted Sensory: Pinprick and light touch intact throughout, bilaterally Deep Tendon Reflexes: 2+ and symmetric with absent AJ's bilaterally. Plantars: Right: upgoing   Left: upgoing Cerebellar: Normal finger-to-nose and normal heel-to-shin testing bilaterally. Gait: Not tested due to  safety concerns.    Laboratory Studies:  Basic Metabolic Panel:  Recent Labs Lab 05/10/17 1234  NA 130*  K 4.1  CL 99*  CO2 21*  GLUCOSE 290*  BUN 14  CREATININE 0.84  CALCIUM 9.1    Liver Function Tests: No results for input(s): AST, ALT, ALKPHOS, BILITOT, PROT, ALBUMIN in the last 168 hours. No results for input(s): LIPASE, AMYLASE in the last 168 hours. No results for input(s): AMMONIA in the last 168 hours.  CBC:  Recent Labs Lab 05/10/17 1234  WBC 8.9  NEUTROABS 5.8  HGB 14.0  HCT 41.2  MCV 89.1  PLT 275    Cardiac Enzymes:  Recent Labs Lab 05/10/17 1234  TROPONINI <0.03    BNP: Invalid input(s): POCBNP  CBG:  Recent Labs Lab 05/11/17 0731 05/11/17 1232  GLUCAP 258* 383*    Microbiology: No results found for this or any previous visit.  Coagulation Studies:  Recent Labs  05/10/17 2221  LABPROT 13.5  INR 1.04    Urinalysis: No results for input(s): COLORURINE, LABSPEC, PHURINE, GLUCOSEU, HGBUR, BILIRUBINUR, KETONESUR, PROTEINUR, UROBILINOGEN, NITRITE, LEUKOCYTESUR in the last 168 hours.  Invalid input(s): APPERANCEUR  Lipid Panel:    Component Value Date/Time   CHOL 129 05/11/2017 0634   TRIG 110 05/11/2017 0634   HDL 37 (L) 05/11/2017 0634   CHOLHDL 3.5 05/11/2017 0634   VLDL  22 05/11/2017 0634   LDLCALC 70 05/11/2017 0634    HgbA1C: No results found for: HGBA1C  Urine Drug Screen:  No results found for: LABOPIA, COCAINSCRNUR, LABBENZ, AMPHETMU, THCU, LABBARB  Alcohol Level: No results for input(s): ETH in the last 168 hours.  Other results: EKG: Sinus rhythm at 83 bpm  Imaging: Ct Head Wo Contrast  Result Date: 05/10/2017 CLINICAL DATA:  Three-day history of right parietal region headache EXAM: CT HEAD WITHOUT CONTRAST TECHNIQUE: Contiguous axial images were obtained from the base of the skull through the vertex without intravenous contrast. COMPARISON:  None. FINDINGS: Brain: There is mild to moderate diffuse  atrophy. There is decreased attenuation in the posterosuperior right temporal lobe near the temporal occipital junction with extension of apparent edema into the posterior right lentiform nucleus. Slightly more superiorly, there is a similar focus of decreased attenuation felt to represent edema in the anterior right parietal lobe. This appearance is somewhat unusual for infarct, and the possibility underlying mass lesions with vasogenic edema must be of concern. Elsewhere, there is slight periventricular small vessel disease in the centra semiovale bilaterally. There is no hemorrhage, midline shift, or extra-axial fluid collection. Vascular: No hyperdense vessel. There is calcification in each carotid siphon region. Skull: The bony calvarium appears intact. Sinuses/Orbits: There is mucosal thickening in several ethmoid air cells bilaterally. Other visualized paranasal sinuses are clear. There is leftward deviation the nasal septum. Orbits appear symmetric bilaterally except for prior cataract removal on the right. Other: Mastoid air cells are clear. There is debris in each external auditory canal. IMPRESSION: 1. Decreased attenuation concerning for vasogenic edema in the superior posterior right temporal lobe extending into the posterior right lentiform nucleus. A second similar focus of concerning edema is noted in the anterior right parietal lobe. While infarct could present in this manner, the appearance raises concern for potential mass lesions with edema. In this regard, would advise brain MRI pre and post-contrast to further assess. If patient is a contraindication to MRI, contrast enhanced brain CT would be an alternative to further evaluate. 2. No hemorrhage. No midline shift or extra-axial fluid collection. There is slight small vessel disease in the centra semiovale bilaterally. 3.  There are foci of arterial vascular calcification. 4. Mucosal thickening in ethmoid air cells. Deviated nasal septum toward  the left. 5.  Probable cerumen in each external auditory canal. Electronically Signed   By: Bretta Bang III M.D.   On: 05/10/2017 13:58   Mr Angiogram Head Wo Contrast  Result Date: 05/10/2017 CLINICAL DATA:  Initial evaluation for sudden onset having, history of multiple sclerosis. EXAM: MRI HEAD WITHOUT AND WITH CONTRAST MRA HEAD WITHOUT CONTRAST TECHNIQUE: Multiplanar, multiecho pulse sequences of the brain and surrounding structures were obtained without and with intravenous contrast. Angiographic images of the head were obtained using MRA technique without contrast. CONTRAST:  20mL MULTIHANCE GADOBENATE DIMEGLUMINE 529 MG/ML IV SOLN COMPARISON:  Prior CT from earlier the same day. FINDINGS: MRI HEAD FINDINGS Brain: Moderate cerebral atrophy. Patchy T2/FLAIR hyperintensity within the periventricular and deep white matter both cerebral hemispheres, nonspecific, but could be related to provided history of multiple sclerosis. Few small remote left cerebellar infarcts noted. The Patchy and confluent restricted diffusion seen involving the posterior right insula, extending into the posterior right frontoparietal region as well as the right occipital lobe, consistent with acute right posterior MCA territory infarct. Involvement of the cortical gray matter as well as the underlying subcortical, deep, and periventricular white matter. Overall infarct volume is  moderate in size. No associated hemorrhage or significant mass effect. Additional patchy small volume ischemic infarcts measuring approximately 15 mm present within the superior left cerebellar hemisphere (series 100, image 15). No associated hemorrhage or mass effect. No other convincing evidence for acute or subacute ischemia. Gray-white matter differentiation otherwise maintained. No evidence for acute or chronic intracranial hemorrhage. No mass lesion, midline shift or mass effect. Mild leptomeningeal enhancement related to the posterior right MCA  territory infarct. No other abnormal enhancement. No findings to suggest active demyelination. No hydrocephalus. No extra-axial fluid collection. Major dural sinuses grossly patent. Incidental note made of a partially empty sella. Midline structures intact and normal. Vascular: Major intracranial vascular flow voids are maintained. Skull and upper cervical spine: Craniocervical junction normal. Upper cervical spine within normal limits. Bone marrow signal intensity normal. No scalp soft tissue abnormality. Sinuses/Orbits: Globes and orbital soft tissues within normal limits. Patient status post cataract extraction on the right. Other: Paranasal sinuses are largely clear. No significant mastoid effusion. MRA HEAD FINDINGS ANTERIOR CIRCULATION: Distal cervical segments patent of the skullbase with antegrade flow. Petrous, cavernous, and supraclinoid segments patent without flow-limiting stenosis. A1 segments patent bilaterally. Left A1 segment slightly hypoplastic, which likely accounts for the slightly diminutive left ICA is compared to the right. A1 segments and anterior communicating artery patent and within normal limits. ACA see irregular bilaterally with moderate multifocal stenoses, greater on the right. ACAs are patent to their distal aspects. M1 segments patent bilaterally without stenosis. Normal MCA bifurcations. Moderate multifocal atheromatous regularity with stenoses involving the right M2 segments and distal right MCA branches. No proximal M2 stenosis or occlusion seen on the left. MCA branches well perfused and fairly symmetric. POSTERIOR CIRCULATION: Vertebral arteries patent to the vertebrobasilar junction without stenosis. Left PICA patent proximally. Right PICA not visualized. Basilar artery patent to its distal aspect without stenosis. Patent ICAs. Both of the posterior cerebral arteries supplied primarily via the basilar. Small posterior communicating arteries noted bilaterally. Right PCA patent  to its distal aspect without flow-limiting stenosis. Distal left P3 segment and distal small branches are attenuated, possibly reflecting severe stenosis. No aneurysm. IMPRESSION: MRI HEAD IMPRESSION: 1. Patchy multifocal moderate sized posterior right MCA territory infarcts, with additional small superior left cerebellar infarct. No associated hemorrhage or mass effect. Possible central thromboembolic etiology is suspected given the various vascular distributions involved. The 2. Underlying mild cerebral white matter changes, nonspecific, but could be consistent with provided history of multiple sclerosis. No evidence for active demyelination. 3. Mild age-related cerebral atrophy. No other acute intracranial abnormality. MRA HEAD IMPRESSION: 1. Negative intracranial MRA for large or proximal arterial branch occlusion. No correctable stenosis. 2. Moderate atheromatous irregularity involving the intracranial circulation, most notably within the anterior cerebral arteries, distal right MCA branches, and distal left PCA. Electronically Signed   By: Rise Mu M.D.   On: 05/10/2017 21:06   Mr Brain W And Wo Contrast  Result Date: 05/10/2017 CLINICAL DATA:  Initial evaluation for sudden onset having, history of multiple sclerosis. EXAM: MRI HEAD WITHOUT AND WITH CONTRAST MRA HEAD WITHOUT CONTRAST TECHNIQUE: Multiplanar, multiecho pulse sequences of the brain and surrounding structures were obtained without and with intravenous contrast. Angiographic images of the head were obtained using MRA technique without contrast. CONTRAST:  20mL MULTIHANCE GADOBENATE DIMEGLUMINE 529 MG/ML IV SOLN COMPARISON:  Prior CT from earlier the same day. FINDINGS: MRI HEAD FINDINGS Brain: Moderate cerebral atrophy. Patchy T2/FLAIR hyperintensity within the periventricular and deep white matter both cerebral hemispheres, nonspecific, but  could be related to provided history of multiple sclerosis. Few small remote left cerebellar  infarcts noted. The Patchy and confluent restricted diffusion seen involving the posterior right insula, extending into the posterior right frontoparietal region as well as the right occipital lobe, consistent with acute right posterior MCA territory infarct. Involvement of the cortical gray matter as well as the underlying subcortical, deep, and periventricular white matter. Overall infarct volume is moderate in size. No associated hemorrhage or significant mass effect. Additional patchy small volume ischemic infarcts measuring approximately 15 mm present within the superior left cerebellar hemisphere (series 100, image 15). No associated hemorrhage or mass effect. No other convincing evidence for acute or subacute ischemia. Gray-white matter differentiation otherwise maintained. No evidence for acute or chronic intracranial hemorrhage. No mass lesion, midline shift or mass effect. Mild leptomeningeal enhancement related to the posterior right MCA territory infarct. No other abnormal enhancement. No findings to suggest active demyelination. No hydrocephalus. No extra-axial fluid collection. Major dural sinuses grossly patent. Incidental note made of a partially empty sella. Midline structures intact and normal. Vascular: Major intracranial vascular flow voids are maintained. Skull and upper cervical spine: Craniocervical junction normal. Upper cervical spine within normal limits. Bone marrow signal intensity normal. No scalp soft tissue abnormality. Sinuses/Orbits: Globes and orbital soft tissues within normal limits. Patient status post cataract extraction on the right. Other: Paranasal sinuses are largely clear. No significant mastoid effusion. MRA HEAD FINDINGS ANTERIOR CIRCULATION: Distal cervical segments patent of the skullbase with antegrade flow. Petrous, cavernous, and supraclinoid segments patent without flow-limiting stenosis. A1 segments patent bilaterally. Left A1 segment slightly hypoplastic, which  likely accounts for the slightly diminutive left ICA is compared to the right. A1 segments and anterior communicating artery patent and within normal limits. ACA see irregular bilaterally with moderate multifocal stenoses, greater on the right. ACAs are patent to their distal aspects. M1 segments patent bilaterally without stenosis. Normal MCA bifurcations. Moderate multifocal atheromatous regularity with stenoses involving the right M2 segments and distal right MCA branches. No proximal M2 stenosis or occlusion seen on the left. MCA branches well perfused and fairly symmetric. POSTERIOR CIRCULATION: Vertebral arteries patent to the vertebrobasilar junction without stenosis. Left PICA patent proximally. Right PICA not visualized. Basilar artery patent to its distal aspect without stenosis. Patent ICAs. Both of the posterior cerebral arteries supplied primarily via the basilar. Small posterior communicating arteries noted bilaterally. Right PCA patent to its distal aspect without flow-limiting stenosis. Distal left P3 segment and distal small branches are attenuated, possibly reflecting severe stenosis. No aneurysm. IMPRESSION: MRI HEAD IMPRESSION: 1. Patchy multifocal moderate sized posterior right MCA territory infarcts, with additional small superior left cerebellar infarct. No associated hemorrhage or mass effect. Possible central thromboembolic etiology is suspected given the various vascular distributions involved. The 2. Underlying mild cerebral white matter changes, nonspecific, but could be consistent with provided history of multiple sclerosis. No evidence for active demyelination. 3. Mild age-related cerebral atrophy. No other acute intracranial abnormality. MRA HEAD IMPRESSION: 1. Negative intracranial MRA for large or proximal arterial branch occlusion. No correctable stenosis. 2. Moderate atheromatous irregularity involving the intracranial circulation, most notably within the anterior cerebral  arteries, distal right MCA branches, and distal left PCA. Electronically Signed   By: Rise Mu M.D.   On: 05/10/2017 21:06   US Carotid Bilateral (at [facility] And [facility] Only)  Result Date: 05/11/2017 CLINICAL DATA:  66 year old female with a history of stroke. Cardiovascular risk factors include hypertension, known prior stroke/ TIA, hyperlipidemia, diabetes EXAM:  BILATERAL CAROTID DUPLEX ULTRASOUND TECHNIQUE: Wallace Cullens scale imaging, color Doppler and duplex ultrasound were performed of bilateral carotid and vertebral arteries in the neck. COMPARISON:  MR 05/10/2017 FINDINGS: Criteria: Quantification of carotid stenosis is based on velocity parameters that correlate the residual internal carotid diameter with NASCET-based stenosis levels, using the diameter of the distal internal carotid lumen as the denominator for stenosis measurement. The following velocity measurements were obtained: RIGHT ICA:  Systolic 96 cm/sec, Diastolic 32 cm/sec CCA:  127 cm/sec SYSTOLIC ICA/CCA RATIO:  0.8 ECA:  132 cm/sec LEFT ICA:  Systolic 65 cm/sec, Diastolic 17 cm/sec CCA:  122 cm/sec SYSTOLIC ICA/CCA RATIO:  0.5 ECA:  118 cm/sec Right Brachial SBP: Not acquired Left Brachial SBP: Not acquired RIGHT CAROTID ARTERY: No significant calcified disease of the right common carotid artery. Intermediate waveform maintained. Heterogeneous plaque without significant calcifications at the right carotid bifurcation. Low resistance waveform of the right ICA. No significant tortuosity. RIGHT VERTEBRAL ARTERY: Antegrade flow with low resistance waveform. LEFT CAROTID ARTERY: No significant calcified disease of the left common carotid artery. Intermediate waveform maintained. Heterogeneous plaque at the left carotid bifurcation without significant calcifications. Low resistance waveform of the left ICA. LEFT VERTEBRAL ARTERY:  Antegrade flow with low resistance waveform. IMPRESSION: Color duplex indicates minimal heterogeneous plaque, with no  hemodynamically significant stenosis by duplex criteria in the extracranial cerebrovascular circulation. Signed, Yvone Neu. Loreta Ave, DO [facility] Electronically Signed   By: Gilmer Mor D.O.   On: 05/11/2017 12:32    Assessment: 66 y.o. female with a history of multiple sclerosis who presents with some confusion and headache.  MRI of the brain reviewed and shows patchy moderate sized right MCA territory infarcts.  There is also an additional small superior left cerebellar infarct.  Embolic etiology is suspected.  Patient on no antiplatelet therapy at home. Carotid Dopplers showed no hemodynamically significant stenosis bilaterally.  Echocardiogram pending.  A1c pending.  LDL 70.  Patient on statin therapy.  Stroke Risk Factors - diabetes mellitus, hyperlipidemia and hypertension  Plan: 1. HgbA1c pending 2. PT consult, OT consult, Speech consult 3. Echocardiogram pending 4. Prophylactic therapy-Antiplatelet med: Aspirin - dose 325mg  daily 5. NPO until RN stroke swallow screen 6. Telemetry monitoring 7. Frequent neuro checks 8.  If telemetry monitoring remains unremarkable during hospitalization would consider outpatient evaluation for Holter/LINQ placement.   Thana Farr, MD Neurology [phone] 05/11/2017, 4:07 PM

## 2017-05-11 NOTE — Evaluation (Signed)
Physical Therapy Evaluation Patient Details Name: Brittany Huffman MRN: [id number] DOB: 05-12-1951 Today's Date: 05/11/2017   History of Present Illness  Brittany Huffman is a 66yo white female who comes to [facility] after 2-3 days of progressively more intense intermittent HA. Pt also reported changes to her haptic perception. PMH: DM, MS, HTN. At baseline, pt lives alone, performs mostly household distance AMB, limited chronci chronic left sided pain. She is fully independnet in [place], uses public transit to access the community and [employer] for grocery shopping.   Clinical Impression  Pt admitted with above diagnosis. Pt currently with functional limitations due to the deficits listed below (see "PT Problem List"). Pt reports full resolution of CC, severe HA and BUE haptic perception changes. Full functional mobility assessment performed with detail below. Pt educated on how PT can address he BLE weakness, pain, and mobility restrictions, as well as improving her balance, confidence, and ability to care for self. Pt is agreeable to trying PT on OP basis. Patient is at baseline, all education completed, and time is given to address all questions/concerns. No additional skilled PT services needed at this time, PT signing off. PT recommends daily ambulation ad lib or with nursing staff as needed to prevent deconditioning.       Follow Up Recommendations Outpatient PT    Equipment Recommendations  None recommended by PT    Recommendations for Other Services       Precautions / Restrictions Precautions Precautions: None      Mobility  Bed Mobility Overal bed mobility: Independent                Transfers Overall transfer level: Independent                  Ambulation/Gait Ambulation/Gait assistance: Independent Ambulation Distance (Feet): 80 Feet Assistive device: None       General Gait Details: flat foot strike bilat, very minimal knee flexion during swing phase (self reported  buckling and quads weakness, abducted RLE, and trendelenburg on LLE; collapsed arches of feet bilat.   Stairs            Wheelchair Mobility    Modified Rankin (Stroke Patients Only)       Balance Overall balance assessment: No apparent balance deficits (not formally assessed) (self reported balance defciits; no recent falls or close calls)                                           Pertinent Vitals/Pain Pain Assessment: No/denies pain    Home Living Family/patient expects to be discharged to:: Private residence Living Arrangements: Alone   Type of Home: Apartment Home Access: Level entry     Home Layout: One level Home Equipment: [place] - 4 wheels;Wheelchair - manual      Prior Function Level of Independence: Independent with assistive device(s)         Comments: [place] with WC and public transit      Hand Dominance        Extremity/Trunk Assessment        Lower Extremity Assessment Lower Extremity Assessment: Generalized weakness       Communication   Communication: No difficulties  Cognition Arousal/Alertness: Awake/alert Behavior During Therapy: WFL for tasks assessed/performed Overall Cognitive Status: Within Functional Limits for tasks assessed  General Comments      Exercises     Assessment/Plan    PT Assessment All further PT needs can be met in the next venue of care;Patent does not need any further PT services  PT Problem List Decreased strength;Decreased range of motion;Decreased activity tolerance;Decreased balance;Decreased mobility;Obesity;Pain       PT Treatment Interventions      PT Goals (Current goals can be found in the Care Plan section)  Acute Rehab PT Goals Patient Stated Goal: improve AMB tolerance, decreased Left back/hip pain with weight bearing activity.  PT Goal Formulation: All assessment and education complete, DC therapy     Frequency     Barriers to discharge        Co-evaluation               AM-PAC PT "6 Clicks" Daily Activity  Outcome Measure Difficulty turning over in bed (including adjusting bedclothes, sheets and blankets)?: None Difficulty moving from lying on back to sitting on the side of the bed? : None Difficulty sitting down on and standing up from a chair with arms (e.g., wheelchair, bedside commode, etc,.)?: A Little Help needed moving to and from a bed to chair (including a wheelchair)?: A Little Help needed walking in hospital room?: A Little Help needed climbing 3-5 steps with a railing? : A Lot 6 Click Score: 19    End of Session   Activity Tolerance: Patient limited by pain Patient left: in bed;with call bell/phone within reach Nurse Communication: Mobility status PT Visit Diagnosis: Other abnormalities of gait and mobility (R26.89);Unsteadiness on feet (R26.81);Difficulty in walking, not elsewhere classified (R26.2);Other symptoms and signs involving the nervous system (R29.898)    Time: 1658-0063 PT Time Calculation (min) (ACUTE ONLY): 11 min   Charges:   PT Evaluation $PT Eval Low Complexity: 1 Low     PT G Codes:       1:10 PM, 05-24-17 Etta Grandchild, PT, DPT Physical Therapist - [place] [phone] ([place])  [phone] (mobile)    Buccola,Allan C 05/24/2017, 1:07 PM

## 2017-05-12 LAB — ECHOCARDIOGRAM COMPLETE
Height: 70 in
WEIGHTICAEL: 4352 [oz_av]

## 2017-05-12 LAB — GLUCOSE, CAPILLARY
Glucose-Capillary: 342 mg/dL — ABNORMAL HIGH (ref 65–99)
Glucose-Capillary: 348 mg/dL — ABNORMAL HIGH (ref 65–99)

## 2017-05-12 MED ORDER — ACETAMINOPHEN 325 MG PO TABS: 325.0000 mg | ORAL_TABLET | Freq: Four times a day (QID) | ORAL | Status: DC | PRN

## 2017-05-12 MED ORDER — ASPIRIN EC 325 MG PO TBEC: 325.0000 mg | DELAYED_RELEASE_TABLET | Freq: Every day | ORAL | 3 refills | Status: AC

## 2017-05-12 NOTE — Discharge Summary (Signed)
[facility] [place] at [facility]   PATIENT NAME: Brittany Huffman    MR#:  [id number]  DATE OF BIRTH:  06/06/1951  DATE OF ADMISSION:  05/10/2017 ADMITTING PHYSICIAN: Oralia Manis, MD  DATE OF DISCHARGE: 05/12/2017 PRIMARY CARE PHYSICIAN: System, Pcp Not In    ADMISSION DIAGNOSIS:  Multiple sclerosis (HCC) [G35] Bad headache [R51] Cerebrovascular accident (CVA), unspecified mechanism (HCC) [I63.9] Vasogenic edema (HCC) [G93.6]  DISCHARGE DIAGNOSIS:  Principal Problem:   Stroke [facility]) Active Problems:   MS (multiple sclerosis) (HCC)   HTN (hypertension)   Diabetes (HCC)   Pressure injury of skin   SECONDARY DIAGNOSIS:   Past Medical History:  Diagnosis Date  . Diabetes mellitus without complication (HCC)    Pt takes insulin.  [name] Hypertension   . Multiple sclerosis [facility])     HOSPITAL COURSE:     * Stroke [facility]) -seems to be embolic MRA of the brain reviewed and reveals patchy moderate size right MCA territory infarcts Patient's symptoms resolved completely Evaluated by neurology who is recommending outpatient holter versus linq monitor and to continue aspirin 325mg  once daily  LDL is 70, cont statin.    patient is evaluated by physical therapy who is recommending outpatient PT and occupational therapy signed off, no needs identified Hemoglobin A1c 8.3 LDL 70 Outpatient follow-up with the cardiology group Dr. Welton Flakes, on Monday November fifth or Tuesday November 6 at 2 PM for Linq monitor Okay to discharge patient from neurology standpoint  *MS (multiple sclerosis) (HCC) -MRI did not indicate any worsening of her multiple sclerosis, continue home meds   *HTN (hypertension) -Allowed permissive hypertension for the first 24 hours after onset of symptoms, now she needs tight control of her blood pressure, at this point blood pressure is 118/54  *Diabetes (HCC) -sliding scale insulin with corresponding glucose checks    DISCHARGE  CONDITIONS:   stable  CONSULTS OBTAINED:  Treatment Team:  Thana Farr, MD   PROCEDURES  None   DRUG ALLERGIES:  No Known Allergies  DISCHARGE MEDICATIONS:   Current Discharge Medication List    CONTINUE these medications which have NOT CHANGED   Details  amLODipine (NORVASC) 10 MG tablet Take 1 tablet by mouth daily.    atorvastatin (LIPITOR) 40 MG tablet Take 1 tablet by mouth daily.    AUBAGIO 14 MG TABS Take 1 tablet by mouth daily.    lisinopril (PRINIVIL,ZESTRIL) 20 MG tablet Take 1 tablet by mouth daily.    NOVOLOG FLEXPEN 100 UNIT/ML FlexPen Inject 0-40 Units into the skin daily. Refills: 1    PARoxetine (PAXIL) 40 MG tablet Take 1 tablet by mouth daily.    TOUJEO SOLOSTAR 300 UNIT/ML SOPN Inject 50 Units into the skin 2 (two) times daily. Refills: 1    TRULICITY 1.5 MG/0.5ML SOPN Inject 0.75 mLs into the skin daily. Refills: 2    WELCHOL 625 MG tablet Take 1 tablet by mouth daily.         DISCHARGE INSTRUCTIONS:   Follow-up with primary care physician in 5-7 days Follow-up with neurology in 1 month Follow-up with cardiology Dr. Welton Flakes on the November 5 of November 6 at 2 PM for linq monitor Op pt  DIET:  Cardiac n diabetic  DISCHARGE CONDITION:  Stable  ACTIVITY:  Activity as tolerated  OXYGEN:  Home Oxygen: No.   Oxygen Delivery: room air  DISCHARGE LOCATION:  home   If you experience worsening of your admission symptoms, develop shortness of breath, life threatening emergency, suicidal  or homicidal thoughts you must seek medical attention immediately by calling 911 or calling your MD immediately  if symptoms less severe.  You Must read complete instructions/literature along with all the possible adverse reactions/side effects for all the Medicines you take and that have been prescribed to you. Take any new Medicines after you have completely understood and accpet all the possible adverse reactions/side effects.   Please  note  You were cared for by a hospitalist during your hospital stay. If you have any questions about your discharge medications or the care you received while you were in the hospital after you are discharged, you can call the unit and asked to speak with the hospitalist on call if the hospitalist that took care of you is not available. Once you are discharged, your primary care physician will handle any further medical issues. Please note that NO REFILLS for any discharge medications will be authorized once you are discharged, as it is imperative that you return to your primary care physician (or establish a relationship with a primary care physician if you do not have one) for your aftercare needs so that they can reassess your need for medications and monitor your lab values.     Today  Chief Complaint  Patient presents with  . Headache   .  Patient is resting comfortably, denies any complaints no difficulty with the speech or swallowing.. Wants to go home  ROS:  CONSTITUTIONAL: Denies fevers, chills. Denies any fatigue, weakness.  EYES: Denies blurry vision, double vision, eye pain. EARS, NOSE, THROAT: Denies tinnitus, ear pain, hearing loss. RESPIRATORY: Denies cough, wheeze, shortness of breath.  CARDIOVASCULAR: Denies chest pain, palpitations, edema.  GASTROINTESTINAL: Denies nausea, vomiting, diarrhea, abdominal pain. Denies bright red blood per rectum. GENITOURINARY: Denies dysuria, hematuria. ENDOCRINE: Denies nocturia or thyroid problems. HEMATOLOGIC AND LYMPHATIC: Denies easy bruising or bleeding. SKIN: Denies rash or lesion. MUSCULOSKELETAL: Denies pain in neck, back, shoulder, knees, hips or arthritic symptoms.  NEUROLOGIC: Denies paralysis, paresthesias.  PSYCHIATRIC: Denies anxiety or depressive symptoms.   VITAL SIGNS:  Blood pressure (!) 118/54, pulse 69, temperature 97.7 F (36.5 C), temperature source Oral, resp. rate 16, height 5\' 10"  (1.778 m), weight 123.4 kg  (272 lb), SpO2 99 %.  I/O:    Intake/Output Summary (Last 24 hours) at 05/12/2017 1121 Last data filed at 05/11/2017 1800 Gross per 24 hour  Intake 480 ml  Output -  Net 480 ml    PHYSICAL EXAMINATION:  GENERAL:  66 y.o.-year-old patient lying in the bed with no acute distress.  EYES: Pupils equal, round, reactive to light and accommodation. No scleral icterus. Extraocular muscles intact.  HEENT: Head atraumatic, normocephalic. Oropharynx and nasopharynx clear.  NECK:  Supple, no jugular venous distention. No thyroid enlargement, no tenderness.  LUNGS: Normal breath sounds bilaterally, no wheezing, rales,rhonchi or crepitation. No use of accessory muscles of respiration.  CARDIOVASCULAR: S1, S2 normal. No murmurs, rubs, or gallops.  ABDOMEN: Soft, non-tender, non-distended. Bowel sounds present. No organomegaly or mass.  EXTREMITIES: No pedal edema, cyanosis, or clubbing.  NEUROLOGIC: Cranial nerves II through XII are intact. Muscle strength 5/5 in all extremities. Sensation intact. Gait not checked.  PSYCHIATRIC: The patient is alert and oriented x 3.  SKIN: No obvious rash, lesion, or ulcer.   DATA REVIEW:   CBC Recent Labs  Lab 05/10/17 1234  WBC 8.9  HGB 14.0  HCT 41.2  PLT 275    Chemistries  Recent Labs  Lab 05/10/17 1234  NA 130*  K 4.1  CL 99*  CO2 21*  GLUCOSE 290*  BUN 14  CREATININE 0.84  CALCIUM 9.1    Cardiac Enzymes Recent Labs  Lab 05/10/17 1234  TROPONINI <0.03    Microbiology Results  No results found for this or any previous visit.  RADIOLOGY:  Ct Head Wo Contrast  Result Date: 05/10/2017 CLINICAL DATA:  Three-day history of right parietal region headache EXAM: CT HEAD WITHOUT CONTRAST TECHNIQUE: Contiguous axial images were obtained from the base of the skull through the vertex without intravenous contrast. COMPARISON:  None. FINDINGS: Brain: There is mild to moderate diffuse atrophy. There is decreased attenuation in the  posterosuperior right temporal lobe near the temporal occipital junction with extension of apparent edema into the posterior right lentiform nucleus. Slightly more superiorly, there is a similar focus of decreased attenuation felt to represent edema in the anterior right parietal lobe. This appearance is somewhat unusual for infarct, and the possibility underlying mass lesions with vasogenic edema must be of concern. Elsewhere, there is slight periventricular small vessel disease in the centra semiovale bilaterally. There is no hemorrhage, midline shift, or extra-axial fluid collection. Vascular: No hyperdense vessel. There is calcification in each carotid siphon region. Skull: The bony calvarium appears intact. Sinuses/Orbits: There is mucosal thickening in several ethmoid air cells bilaterally. Other visualized paranasal sinuses are clear. There is leftward deviation the nasal septum. Orbits appear symmetric bilaterally except for prior cataract removal on the right. Other: Mastoid air cells are clear. There is debris in each external auditory canal. IMPRESSION: 1. Decreased attenuation concerning for vasogenic edema in the superior posterior right temporal lobe extending into the posterior right lentiform nucleus. A second similar focus of concerning edema is noted in the anterior right parietal lobe. While infarct could present in this manner, the appearance raises concern for potential mass lesions with edema. In this regard, would advise brain MRI pre and post-contrast to further assess. If patient is a contraindication to MRI, contrast enhanced brain CT would be an alternative to further evaluate. 2. No hemorrhage. No midline shift or extra-axial fluid collection. There is slight small vessel disease in the centra semiovale bilaterally. 3.  There are foci of arterial vascular calcification. 4. Mucosal thickening in ethmoid air cells. Deviated nasal septum toward the left. 5.  Probable cerumen in each external  auditory canal. Electronically Signed   By: Bretta Bang III M.D.   On: 05/10/2017 13:58   Mr Angiogram Head Wo Contrast  Result Date: 05/10/2017 CLINICAL DATA:  Initial evaluation for sudden onset having, history of multiple sclerosis. EXAM: MRI HEAD WITHOUT AND WITH CONTRAST MRA HEAD WITHOUT CONTRAST TECHNIQUE: Multiplanar, multiecho pulse sequences of the brain and surrounding structures were obtained without and with intravenous contrast. Angiographic images of the head were obtained using MRA technique without contrast. CONTRAST:  49mL MULTIHANCE GADOBENATE DIMEGLUMINE 529 MG/ML IV SOLN COMPARISON:  Prior CT from earlier the same day. FINDINGS: MRI HEAD FINDINGS Brain: Moderate cerebral atrophy. Patchy T2/FLAIR hyperintensity within the periventricular and deep white matter both cerebral hemispheres, nonspecific, but could be related to provided history of multiple sclerosis. Few small remote left cerebellar infarcts noted. The Patchy and confluent restricted diffusion seen involving the posterior right insula, extending into the posterior right frontoparietal region as well as the right occipital lobe, consistent with acute right posterior MCA territory infarct. Involvement of the cortical gray matter as well as the underlying subcortical, deep, and periventricular white matter. Overall infarct volume is moderate in size. No associated  hemorrhage or significant mass effect. Additional patchy small volume ischemic infarcts measuring approximately 15 mm present within the superior left cerebellar hemisphere (series 100, image 15). No associated hemorrhage or mass effect. No other convincing evidence for acute or subacute ischemia. Gray-white matter differentiation otherwise maintained. No evidence for acute or chronic intracranial hemorrhage. No mass lesion, midline shift or mass effect. Mild leptomeningeal enhancement related to the posterior right MCA territory infarct. No other abnormal enhancement.  No findings to suggest active demyelination. No hydrocephalus. No extra-axial fluid collection. Major dural sinuses grossly patent. Incidental note made of a partially empty sella. Midline structures intact and normal. Vascular: Major intracranial vascular flow voids are maintained. Skull and upper cervical spine: Craniocervical junction normal. Upper cervical spine within normal limits. Bone marrow signal intensity normal. No scalp soft tissue abnormality. Sinuses/Orbits: Globes and orbital soft tissues within normal limits. Patient status post cataract extraction on the right. Other: Paranasal sinuses are largely clear. No significant mastoid effusion. MRA HEAD FINDINGS ANTERIOR CIRCULATION: Distal cervical segments patent of the skullbase with antegrade flow. Petrous, cavernous, and supraclinoid segments patent without flow-limiting stenosis. A1 segments patent bilaterally. Left A1 segment slightly hypoplastic, which likely accounts for the slightly diminutive left ICA is compared to the right. A1 segments and anterior communicating artery patent and within normal limits. ACA see irregular bilaterally with moderate multifocal stenoses, greater on the right. ACAs are patent to their distal aspects. M1 segments patent bilaterally without stenosis. Normal MCA bifurcations. Moderate multifocal atheromatous regularity with stenoses involving the right M2 segments and distal right MCA branches. No proximal M2 stenosis or occlusion seen on the left. MCA branches well perfused and fairly symmetric. POSTERIOR CIRCULATION: Vertebral arteries patent to the vertebrobasilar junction without stenosis. Left PICA patent proximally. Right PICA not visualized. Basilar artery patent to its distal aspect without stenosis. Patent ICAs. Both of the posterior cerebral arteries supplied primarily via the basilar. Small posterior communicating arteries noted bilaterally. Right PCA patent to its distal aspect without flow-limiting  stenosis. Distal left P3 segment and distal small branches are attenuated, possibly reflecting severe stenosis. No aneurysm. IMPRESSION: MRI HEAD IMPRESSION: 1. Patchy multifocal moderate sized posterior right MCA territory infarcts, with additional small superior left cerebellar infarct. No associated hemorrhage or mass effect. Possible central thromboembolic etiology is suspected given the various vascular distributions involved. The 2. Underlying mild cerebral white matter changes, nonspecific, but could be consistent with provided history of multiple sclerosis. No evidence for active demyelination. 3. Mild age-related cerebral atrophy. No other acute intracranial abnormality. MRA HEAD IMPRESSION: 1. Negative intracranial MRA for large or proximal arterial branch occlusion. No correctable stenosis. 2. Moderate atheromatous irregularity involving the intracranial circulation, most notably within the anterior cerebral arteries, distal right MCA branches, and distal left PCA. Electronically Signed   By: Rise MuBenjamin  McClintock M.D.   On: 05/10/2017 21:06   Mr Brain W And Wo Contrast  Result Date: 05/10/2017 CLINICAL DATA:  Initial evaluation for sudden onset having, history of multiple sclerosis. EXAM: MRI HEAD WITHOUT AND WITH CONTRAST MRA HEAD WITHOUT CONTRAST TECHNIQUE: Multiplanar, multiecho pulse sequences of the brain and surrounding structures were obtained without and with intravenous contrast. Angiographic images of the head were obtained using MRA technique without contrast. CONTRAST:  20mL MULTIHANCE GADOBENATE DIMEGLUMINE 529 MG/ML IV SOLN COMPARISON:  Prior CT from earlier the same day. FINDINGS: MRI HEAD FINDINGS Brain: Moderate cerebral atrophy. Patchy T2/FLAIR hyperintensity within the periventricular and deep white matter both cerebral hemispheres, nonspecific, but could be related to provided  history of multiple sclerosis. Few small remote left cerebellar infarcts noted. The Patchy and confluent  restricted diffusion seen involving the posterior right insula, extending into the posterior right frontoparietal region as well as the right occipital lobe, consistent with acute right posterior MCA territory infarct. Involvement of the cortical gray matter as well as the underlying subcortical, deep, and periventricular white matter. Overall infarct volume is moderate in size. No associated hemorrhage or significant mass effect. Additional patchy small volume ischemic infarcts measuring approximately 15 mm present within the superior left cerebellar hemisphere (series 100, image 15). No associated hemorrhage or mass effect. No other convincing evidence for acute or subacute ischemia. Gray-white matter differentiation otherwise maintained. No evidence for acute or chronic intracranial hemorrhage. No mass lesion, midline shift or mass effect. Mild leptomeningeal enhancement related to the posterior right MCA territory infarct. No other abnormal enhancement. No findings to suggest active demyelination. No hydrocephalus. No extra-axial fluid collection. Major dural sinuses grossly patent. Incidental note made of a partially empty sella. Midline structures intact and normal. Vascular: Major intracranial vascular flow voids are maintained. Skull and upper cervical spine: Craniocervical junction normal. Upper cervical spine within normal limits. Bone marrow signal intensity normal. No scalp soft tissue abnormality. Sinuses/Orbits: Globes and orbital soft tissues within normal limits. Patient status post cataract extraction on the right. Other: Paranasal sinuses are largely clear. No significant mastoid effusion. MRA HEAD FINDINGS ANTERIOR CIRCULATION: Distal cervical segments patent of the skullbase with antegrade flow. Petrous, cavernous, and supraclinoid segments patent without flow-limiting stenosis. A1 segments patent bilaterally. Left A1 segment slightly hypoplastic, which likely accounts for the slightly diminutive  left ICA is compared to the right. A1 segments and anterior communicating artery patent and within normal limits. ACA see irregular bilaterally with moderate multifocal stenoses, greater on the right. ACAs are patent to their distal aspects. M1 segments patent bilaterally without stenosis. Normal MCA bifurcations. Moderate multifocal atheromatous regularity with stenoses involving the right M2 segments and distal right MCA branches. No proximal M2 stenosis or occlusion seen on the left. MCA branches well perfused and fairly symmetric. POSTERIOR CIRCULATION: Vertebral arteries patent to the vertebrobasilar junction without stenosis. Left PICA patent proximally. Right PICA not visualized. Basilar artery patent to its distal aspect without stenosis. Patent ICAs. Both of the posterior cerebral arteries supplied primarily via the basilar. Small posterior communicating arteries noted bilaterally. Right PCA patent to its distal aspect without flow-limiting stenosis. Distal left P3 segment and distal small branches are attenuated, possibly reflecting severe stenosis. No aneurysm. IMPRESSION: MRI HEAD IMPRESSION: 1. Patchy multifocal moderate sized posterior right MCA territory infarcts, with additional small superior left cerebellar infarct. No associated hemorrhage or mass effect. Possible central thromboembolic etiology is suspected given the various vascular distributions involved. The 2. Underlying mild cerebral white matter changes, nonspecific, but could be consistent with provided history of multiple sclerosis. No evidence for active demyelination. 3. Mild age-related cerebral atrophy. No other acute intracranial abnormality. MRA HEAD IMPRESSION: 1. Negative intracranial MRA for large or proximal arterial branch occlusion. No correctable stenosis. 2. Moderate atheromatous irregularity involving the intracranial circulation, most notably within the anterior cerebral arteries, distal right MCA branches, and distal left  PCA. Electronically Signed   By: Rise Mu M.D.   On: 05/10/2017 21:06   US Carotid Bilateral (at [facility] And [facility] Only)  Result Date: 05/11/2017 CLINICAL DATA:  66 year old female with a history of stroke. Cardiovascular risk factors include hypertension, known prior stroke/ TIA, hyperlipidemia, diabetes EXAM: BILATERAL CAROTID DUPLEX ULTRASOUND TECHNIQUE:  Gray scale imaging, color Doppler and duplex ultrasound were performed of bilateral carotid and vertebral arteries in the neck. COMPARISON:  MR 05/10/2017 FINDINGS: Criteria: Quantification of carotid stenosis is based on velocity parameters that correlate the residual internal carotid diameter with NASCET-based stenosis levels, using the diameter of the distal internal carotid lumen as the denominator for stenosis measurement. The following velocity measurements were obtained: RIGHT ICA:  Systolic 96 cm/sec, Diastolic 32 cm/sec CCA:  127 cm/sec SYSTOLIC ICA/CCA RATIO:  0.8 ECA:  132 cm/sec LEFT ICA:  Systolic 65 cm/sec, Diastolic 17 cm/sec CCA:  122 cm/sec SYSTOLIC ICA/CCA RATIO:  0.5 ECA:  118 cm/sec Right Brachial SBP: Not acquired Left Brachial SBP: Not acquired RIGHT CAROTID ARTERY: No significant calcified disease of the right common carotid artery. Intermediate waveform maintained. Heterogeneous plaque without significant calcifications at the right carotid bifurcation. Low resistance waveform of the right ICA. No significant tortuosity. RIGHT VERTEBRAL ARTERY: Antegrade flow with low resistance waveform. LEFT CAROTID ARTERY: No significant calcified disease of the left common carotid artery. Intermediate waveform maintained. Heterogeneous plaque at the left carotid bifurcation without significant calcifications. Low resistance waveform of the left ICA. LEFT VERTEBRAL ARTERY:  Antegrade flow with low resistance waveform. IMPRESSION: Color duplex indicates minimal heterogeneous plaque, with no hemodynamically significant stenosis by duplex  criteria in the extracranial cerebrovascular circulation. Signed, Yvone Neu. Loreta Ave, DO [facility] Electronically Signed   By: Gilmer Mor D.O.   On: 05/11/2017 12:32    EKG:   Orders placed or performed during the hospital encounter of 05/10/17  . ED EKG  . ED EKG  . EKG 12-Lead  . EKG 12-Lead      Management plans discussed with the patient, family and they are in agreement.  CODE STATUS:     Code Status Orders  (From admission, onward)        Start     Ordered   05/11/17 0112  Full code  Continuous     05/11/17 0111    Code Status History    Date Active Date Inactive Code Status Order ID Comments User Context   This patient has a current code status but no historical code status.      TOTAL TIME TAKING CARE OF THIS PATIENT: 43 minutes.   Note: This dictation was prepared with Dragon dictation along with smaller phrase technology. Any transcriptional errors that result from this process are unintentional.   @[facility] @  on 05/12/2017 at 11:21 AM  Between 7am to 6pm - Pager - [phone]  After 6pm go to [email or web address] - password [email or web address]  [facility]  Office  [phone]  CC: Primary care physician; System, Pcp Not In

## 2017-05-12 NOTE — Progress Notes (Signed)
[facility] [facility] [place] at [facility]   PATIENT NAME: Brittany Huffman    MR#:  [id number]  DATE OF BIRTH:  1951/02/26  SUBJECTIVE:  CHIEF COMPLAINT:   Chief Complaint  Patient presents with  . 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 No symptoms today.  REVIEW OF SYSTEMS:  CONSTITUTIONAL: No fever, fatigue or weakness.  EYES: No blurred or double vision.  EARS, NOSE, AND THROAT: No tinnitus or ear pain.  RESPIRATORY: No cough, shortness of breath, wheezing or hemoptysis.  CARDIOVASCULAR: No chest pain, orthopnea, edema.  GASTROINTESTINAL: No nausea, vomiting, diarrhea or abdominal pain.  GENITOURINARY: No dysuria, hematuria.  ENDOCRINE: No polyuria, nocturia,  HEMATOLOGY: No anemia, easy bruising or bleeding SKIN: No rash or lesion. MUSCULOSKELETAL: No joint pain or arthritis.   NEUROLOGIC: No tingling, numbness, weakness.  PSYCHIATRY: No anxiety or depression.   ROS  DRUG ALLERGIES:  No Known Allergies  VITALS:  Blood pressure (!) 110/54, pulse 73, temperature 97.7 F (36.5 C), temperature source Oral, resp. rate 20, height 5\' 10"  (1.778 m), weight 123.4 kg (272 lb), SpO2 99 %.  PHYSICAL EXAMINATION:   Constitutional: She is oriented to person, place, and time. She appears well-developed and well-nourished. No distress.  HENT:  Head: Normocephalic and atraumatic.  Mouth/Throat: Oropharynx is clear and moist.  Eyes: Pupils are equal, round, and reactive to light. Conjunctivae and EOM are normal. No scleral icterus.  Neck: Normal range of motion. Neck supple. No JVD present. No thyromegaly present.  Cardiovascular: Normal rate, regular rhythm and intact distal pulses.  Exam reveals no gallop and no friction rub.   No murmur heard. Respiratory: Effort normal and breath sounds normal. No respiratory distress. She has no wheezes. She has no rales.  GI: Soft. Bowel sounds are normal. She exhibits no distension.  There is no tenderness.  Musculoskeletal: Normal range of motion. She exhibits no edema.  No arthritis, no gout  Lymphadenopathy:    She has no cervical adenopathy.  Neurological: She is alert and oriented to person, place, and time. No cranial nerve deficit.  Neurologic: Cranial nerves II-XII intact, Sensation intact to light touch/pinprick, 5/5 strength in all extremities, no dysarthria, no aphasia, no dysphagia, memory intact, patient still has mild reported abnormality of abnormal perception of sensation (states that "things just do not feel like what they felt like before, like I have a hard time recognizing how objects felt before")  Skin: Skin is warm and dry. No rash noted. No erythema.  Psychiatric: She has a normal mood and affect. Her behavior is normal. Judgment and thought content normal.   Physical Exam LABORATORY PANEL:   CBC  Recent Labs Lab 05/10/17 1234  WBC 8.9  HGB 14.0  HCT 41.2  PLT 275   ------------------------------------------------------------------------------------------------------------------  Chemistries   Recent Labs Lab 05/10/17 1234  NA 130*  K 4.1  CL 99*  CO2 21*  GLUCOSE 290*  BUN 14  CREATININE 0.84  CALCIUM 9.1   ------------------------------------------------------------------------------------------------------------------  Cardiac Enzymes  Recent Labs Lab 05/10/17 1234  TROPONINI <0.03   ------------------------------------------------------------------------------------------------------------------  RADIOLOGY:  Ct Head Wo Contrast  Result Date: 05/10/2017 CLINICAL DATA:  Three-day history of right parietal region headache EXAM: CT HEAD WITHOUT CONTRAST TECHNIQUE: Contiguous axial images were obtained from the base of the skull through the vertex without intravenous contrast. COMPARISON:  None. FINDINGS: Brain: There is mild to moderate diffuse atrophy. There is decreased attenuation in the posterosuperior right  temporal lobe near the temporal occipital junction with extension of apparent edema into the posterior right lentiform nucleus. Slightly more superiorly, there is a similar focus of decreased attenuation felt to represent edema in the anterior right parietal lobe. This appearance is somewhat unusual for infarct, and the possibility underlying mass lesions with vasogenic edema must be of concern. Elsewhere, there is slight periventricular small vessel disease in the centra semiovale bilaterally. There is no hemorrhage, midline shift, or extra-axial fluid collection. Vascular: No hyperdense vessel. There is calcification in each carotid siphon region. Skull: The bony calvarium appears intact. Sinuses/Orbits: There is mucosal thickening in several ethmoid air cells bilaterally. Other visualized paranasal sinuses are clear. There is leftward deviation the nasal septum. Orbits appear symmetric bilaterally except for prior cataract removal on the right. Other: Mastoid air cells are clear. There is debris in each external auditory canal. IMPRESSION: 1. Decreased attenuation concerning for vasogenic edema in the superior posterior right temporal lobe extending into the posterior right lentiform nucleus. A second similar focus of concerning edema is noted in the anterior right parietal lobe. While infarct could present in this manner, the appearance raises concern for potential mass lesions with edema. In this regard, would advise brain MRI pre and post-contrast to further assess. If patient is a contraindication to MRI, contrast enhanced brain CT would be an alternative to further evaluate. 2. No hemorrhage. No midline shift or extra-axial fluid collection. There is slight small vessel disease in the centra semiovale bilaterally. 3.  There are foci of arterial vascular calcification. 4. Mucosal thickening in ethmoid air cells. Deviated nasal septum toward the left. 5.  Probable cerumen in each external auditory canal.  Electronically Signed   By: Bretta Bang III M.D.   On: 05/10/2017 13:58   Mr Angiogram Head Wo Contrast  Result Date: 05/10/2017 CLINICAL DATA:  Initial evaluation for sudden onset having, history of multiple sclerosis. EXAM: MRI HEAD WITHOUT AND WITH CONTRAST MRA HEAD WITHOUT CONTRAST TECHNIQUE: Multiplanar, multiecho pulse sequences of the brain and surrounding structures were obtained without and with intravenous contrast. Angiographic images of the head were obtained using MRA technique without contrast. CONTRAST:  20mL MULTIHANCE GADOBENATE DIMEGLUMINE 529 MG/ML IV SOLN COMPARISON:  Prior CT from earlier the same day. FINDINGS: MRI HEAD FINDINGS Brain: Moderate cerebral atrophy. Patchy T2/FLAIR hyperintensity within the periventricular and deep white matter both cerebral hemispheres, nonspecific, but could be related to provided history of multiple sclerosis. Few small remote left cerebellar infarcts noted. The Patchy and confluent restricted diffusion seen involving the posterior right insula, extending into the posterior right frontoparietal region as well as the right occipital lobe, consistent with acute right posterior MCA territory infarct. Involvement of the cortical gray matter as well as the underlying subcortical, deep, and periventricular white matter. Overall infarct volume is moderate in size. No associated hemorrhage or significant mass effect. Additional patchy small volume ischemic infarcts measuring approximately 15 mm present within the superior left cerebellar hemisphere (series 100, image 15). No associated hemorrhage or mass effect. No other convincing evidence for acute or subacute ischemia. Gray-white matter differentiation otherwise maintained. No evidence for acute or chronic intracranial hemorrhage. No mass lesion, midline shift or mass effect. Mild leptomeningeal enhancement related to the posterior right MCA territory infarct. No other abnormal enhancement. No findings to  suggest active demyelination. No hydrocephalus. No extra-axial fluid collection. Major dural sinuses grossly patent. Incidental note made of a partially empty sella. Midline structures intact and normal. Vascular: Major intracranial vascular flow voids  are maintained. Skull and upper cervical spine: Craniocervical junction normal. Upper cervical spine within normal limits. Bone marrow signal intensity normal. No scalp soft tissue abnormality. Sinuses/Orbits: Globes and orbital soft tissues within normal limits. Patient status post cataract extraction on the right. Other: Paranasal sinuses are largely clear. No significant mastoid effusion. MRA HEAD FINDINGS ANTERIOR CIRCULATION: Distal cervical segments patent of the skullbase with antegrade flow. Petrous, cavernous, and supraclinoid segments patent without flow-limiting stenosis. A1 segments patent bilaterally. Left A1 segment slightly hypoplastic, which likely accounts for the slightly diminutive left ICA is compared to the right. A1 segments and anterior communicating artery patent and within normal limits. ACA see irregular bilaterally with moderate multifocal stenoses, greater on the right. ACAs are patent to their distal aspects. M1 segments patent bilaterally without stenosis. Normal MCA bifurcations. Moderate multifocal atheromatous regularity with stenoses involving the right M2 segments and distal right MCA branches. No proximal M2 stenosis or occlusion seen on the left. MCA branches well perfused and fairly symmetric. POSTERIOR CIRCULATION: Vertebral arteries patent to the vertebrobasilar junction without stenosis. Left PICA patent proximally. Right PICA not visualized. Basilar artery patent to its distal aspect without stenosis. Patent ICAs. Both of the posterior cerebral arteries supplied primarily via the basilar. Small posterior communicating arteries noted bilaterally. Right PCA patent to its distal aspect without flow-limiting stenosis. Distal left  P3 segment and distal small branches are attenuated, possibly reflecting severe stenosis. No aneurysm. IMPRESSION: MRI HEAD IMPRESSION: 1. Patchy multifocal moderate sized posterior right MCA territory infarcts, with additional small superior left cerebellar infarct. No associated hemorrhage or mass effect. Possible central thromboembolic etiology is suspected given the various vascular distributions involved. The 2. Underlying mild cerebral white matter changes, nonspecific, but could be consistent with provided history of multiple sclerosis. No evidence for active demyelination. 3. Mild age-related cerebral atrophy. No other acute intracranial abnormality. MRA HEAD IMPRESSION: 1. Negative intracranial MRA for large or proximal arterial branch occlusion. No correctable stenosis. 2. Moderate atheromatous irregularity involving the intracranial circulation, most notably within the anterior cerebral arteries, distal right MCA branches, and distal left PCA. Electronically Signed   By: Rise Mu M.D.   On: 05/10/2017 21:06   Mr Brain W And Wo Contrast  Result Date: 05/10/2017 CLINICAL DATA:  Initial evaluation for sudden onset having, history of multiple sclerosis. EXAM: MRI HEAD WITHOUT AND WITH CONTRAST MRA HEAD WITHOUT CONTRAST TECHNIQUE: Multiplanar, multiecho pulse sequences of the brain and surrounding structures were obtained without and with intravenous contrast. Angiographic images of the head were obtained using MRA technique without contrast. CONTRAST:  20mL MULTIHANCE GADOBENATE DIMEGLUMINE 529 MG/ML IV SOLN COMPARISON:  Prior CT from earlier the same day. FINDINGS: MRI HEAD FINDINGS Brain: Moderate cerebral atrophy. Patchy T2/FLAIR hyperintensity within the periventricular and deep white matter both cerebral hemispheres, nonspecific, but could be related to provided history of multiple sclerosis. Few small remote left cerebellar infarcts noted. The Patchy and confluent restricted diffusion  seen involving the posterior right insula, extending into the posterior right frontoparietal region as well as the right occipital lobe, consistent with acute right posterior MCA territory infarct. Involvement of the cortical gray matter as well as the underlying subcortical, deep, and periventricular white matter. Overall infarct volume is moderate in size. No associated hemorrhage or significant mass effect. Additional patchy small volume ischemic infarcts measuring approximately 15 mm present within the superior left cerebellar hemisphere (series 100, image 15). No associated hemorrhage or mass effect. No other convincing evidence for acute or subacute ischemia. Gray-white  matter differentiation otherwise maintained. No evidence for acute or chronic intracranial hemorrhage. No mass lesion, midline shift or mass effect. Mild leptomeningeal enhancement related to the posterior right MCA territory infarct. No other abnormal enhancement. No findings to suggest active demyelination. No hydrocephalus. No extra-axial fluid collection. Major dural sinuses grossly patent. Incidental note made of a partially empty sella. Midline structures intact and normal. Vascular: Major intracranial vascular flow voids are maintained. Skull and upper cervical spine: Craniocervical junction normal. Upper cervical spine within normal limits. Bone marrow signal intensity normal. No scalp soft tissue abnormality. Sinuses/Orbits: Globes and orbital soft tissues within normal limits. Patient status post cataract extraction on the right. Other: Paranasal sinuses are largely clear. No significant mastoid effusion. MRA HEAD FINDINGS ANTERIOR CIRCULATION: Distal cervical segments patent of the skullbase with antegrade flow. Petrous, cavernous, and supraclinoid segments patent without flow-limiting stenosis. A1 segments patent bilaterally. Left A1 segment slightly hypoplastic, which likely accounts for the slightly diminutive left ICA is compared  to the right. A1 segments and anterior communicating artery patent and within normal limits. ACA see irregular bilaterally with moderate multifocal stenoses, greater on the right. ACAs are patent to their distal aspects. M1 segments patent bilaterally without stenosis. Normal MCA bifurcations. Moderate multifocal atheromatous regularity with stenoses involving the right M2 segments and distal right MCA branches. No proximal M2 stenosis or occlusion seen on the left. MCA branches well perfused and fairly symmetric. POSTERIOR CIRCULATION: Vertebral arteries patent to the vertebrobasilar junction without stenosis. Left PICA patent proximally. Right PICA not visualized. Basilar artery patent to its distal aspect without stenosis. Patent ICAs. Both of the posterior cerebral arteries supplied primarily via the basilar. Small posterior communicating arteries noted bilaterally. Right PCA patent to its distal aspect without flow-limiting stenosis. Distal left P3 segment and distal small branches are attenuated, possibly reflecting severe stenosis. No aneurysm. IMPRESSION: MRI HEAD IMPRESSION: 1. Patchy multifocal moderate sized posterior right MCA territory infarcts, with additional small superior left cerebellar infarct. No associated hemorrhage or mass effect. Possible central thromboembolic etiology is suspected given the various vascular distributions involved. The 2. Underlying mild cerebral white matter changes, nonspecific, but could be consistent with provided history of multiple sclerosis. No evidence for active demyelination. 3. Mild age-related cerebral atrophy. No other acute intracranial abnormality. MRA HEAD IMPRESSION: 1. Negative intracranial MRA for large or proximal arterial branch occlusion. No correctable stenosis. 2. Moderate atheromatous irregularity involving the intracranial circulation, most notably within the anterior cerebral arteries, distal right MCA branches, and distal left PCA. Electronically  Signed   By: Rise Mu M.D.   On: 05/10/2017 21:06   US Carotid Bilateral (at [facility] And [facility] Only)  Result Date: 05/11/2017 CLINICAL DATA:  66 year old female with a history of stroke. Cardiovascular risk factors include hypertension, known prior stroke/ TIA, hyperlipidemia, diabetes EXAM: BILATERAL CAROTID DUPLEX ULTRASOUND TECHNIQUE: Wallace Cullens scale imaging, color Doppler and duplex ultrasound were performed of bilateral carotid and vertebral arteries in the neck. COMPARISON:  MR 05/10/2017 FINDINGS: Criteria: Quantification of carotid stenosis is based on velocity parameters that correlate the residual internal carotid diameter with NASCET-based stenosis levels, using the diameter of the distal internal carotid lumen as the denominator for stenosis measurement. The following velocity measurements were obtained: RIGHT ICA:  Systolic 96 cm/sec, Diastolic 32 cm/sec CCA:  127 cm/sec SYSTOLIC ICA/CCA RATIO:  0.8 ECA:  132 cm/sec LEFT ICA:  Systolic 65 cm/sec, Diastolic 17 cm/sec CCA:  122 cm/sec SYSTOLIC ICA/CCA RATIO:  0.5 ECA:  118 cm/sec Right Brachial SBP: Not  acquired Left Brachial SBP: Not acquired RIGHT CAROTID ARTERY: No significant calcified disease of the right common carotid artery. Intermediate waveform maintained. Heterogeneous plaque without significant calcifications at the right carotid bifurcation. Low resistance waveform of the right ICA. No significant tortuosity. RIGHT VERTEBRAL ARTERY: Antegrade flow with low resistance waveform. LEFT CAROTID ARTERY: No significant calcified disease of the left common carotid artery. Intermediate waveform maintained. Heterogeneous plaque at the left carotid bifurcation without significant calcifications. Low resistance waveform of the left ICA. LEFT VERTEBRAL ARTERY:  Antegrade flow with low resistance waveform. IMPRESSION: Color duplex indicates minimal heterogeneous plaque, with no hemodynamically significant stenosis by duplex criteria in the  extracranial cerebrovascular circulation. Signed, Yvone Neu. Loreta Ave, DO [facility] Electronically Signed   By: Gilmer Mor D.O.   On: 05/11/2017 12:32    ASSESSMENT AND PLAN:   Principal Problem:   Stroke [facility]) Active Problems:   MS (multiple sclerosis) (HCC)   HTN (hypertension)   Diabetes (HCC)   Pressure injury of skin  * Stroke [facility]) - neurology consult.  Patient has an embolic source , echocardiogram will hopefully elucidate any potential cardiac source of emboli. PT, OT eval  LDL is 70, cont statin.   COnt ASA.  *  MS (multiple sclerosis) (HCC) -MRI did not indicate any worsening of her multiple sclerosis, continue home meds for this *  HTN (hypertension) -permissive hypertension for the first 24 hours after onset of symptoms, blood pressure goal less than 220/120 *  Diabetes (HCC) -sliding scale insulin with corresponding glucose checks   All the records are reviewed and case discussed with Care Management/Social Workerr. Management plans discussed with the patient, family and they are in agreement.  CODE STATUS: Full  TOTAL TIME TAKING CARE OF THIS PATIENT: 35 minutes.     POSSIBLE D/C IN 1-2 DAYS, DEPENDING ON CLINICAL CONDITION.   Altamese Dilling M.D on 05/12/2017   Between 7am to 6pm - Pager - [phone]  After 6pm go to [email or web address] - password [email or web address]  [facility]  Office  [phone]  CC: Primary care physician; System, Pcp Not In  Note: This dictation was prepared with Dragon dictation along with smaller phrase technology. Any transcriptional errors that result from this process are unintentional.

## 2017-05-12 NOTE — Progress Notes (Signed)
Pt called to advise that her ride was here to take her home; pt discharged via wheelchair by nursing to the visitor's entrance

## 2017-05-12 NOTE — Progress Notes (Signed)
MD order received in [facility] to discharge pt home today; verbally reviewed AVS with pt; no questions voiced at this time; pt's discharge pending arrival of her ride home

## 2017-06-27 ENCOUNTER — Other Ambulatory Visit: Payer: Self-pay | Admitting: Neurology

## 2017-06-27 DIAGNOSIS — G35 Multiple sclerosis: Secondary | ICD-10-CM

## 2017-07-08 ENCOUNTER — Other Ambulatory Visit: Payer: Medicare Other

## 2017-07-08 ENCOUNTER — Ambulatory Visit: Payer: Medicare Other

## 2017-07-10 ENCOUNTER — Ambulatory Visit
Admission: RE | Admit: 2017-07-10 | Discharge: 2017-07-10 | Disposition: A | Discharge status: Home / Self Care | Payer: Medicare Other | Source: Ambulatory Visit | Attending: Neurology | Admitting: Neurology

## 2017-07-10 ENCOUNTER — Other Ambulatory Visit: Payer: Self-pay | Admitting: Neurology

## 2017-07-10 DIAGNOSIS — G35 Multiple sclerosis: Secondary | ICD-10-CM

## 2017-07-10 DIAGNOSIS — M4802 Spinal stenosis, cervical region: Secondary | ICD-10-CM | POA: Insufficient documentation

## 2017-07-10 LAB — POCT I-STAT CREATININE: Creatinine, Ser: 0.8 mg/dL (ref 0.44–1.00)

## 2017-07-10 MED ORDER — GADOBENATE DIMEGLUMINE 529 MG/ML IV SOLN: 20.0000 mL | Freq: Once | INTRAVENOUS | Status: DC | PRN

## 2017-07-29 ENCOUNTER — Encounter: Payer: Self-pay | Admitting: Urology

## 2017-07-29 ENCOUNTER — Ambulatory Visit: Payer: Self-pay | Admitting: Urology

## 2017-12-03 IMAGING — US US CAROTID DUPLEX BILAT
1 series · 13 of 24 positions shown · non-contrast
Comparison: MR 05/10/2017

CLINICAL DATA: 66-year-old female with a history of stroke.

Cardiovascular risk factors include hypertension, known prior
stroke/ TIA, hyperlipidemia, diabetes
EXAM:
BILATERAL CAROTID DUPLEX ULTRASOUND
TECHNIQUE: Gray scale imaging, color Doppler and duplex ultrasound were
performed of bilateral carotid and vertebral arteries in the neck.

[Series 1: us carotid duplex bilat · 13 of 64 slices shown]
[im 1/64]
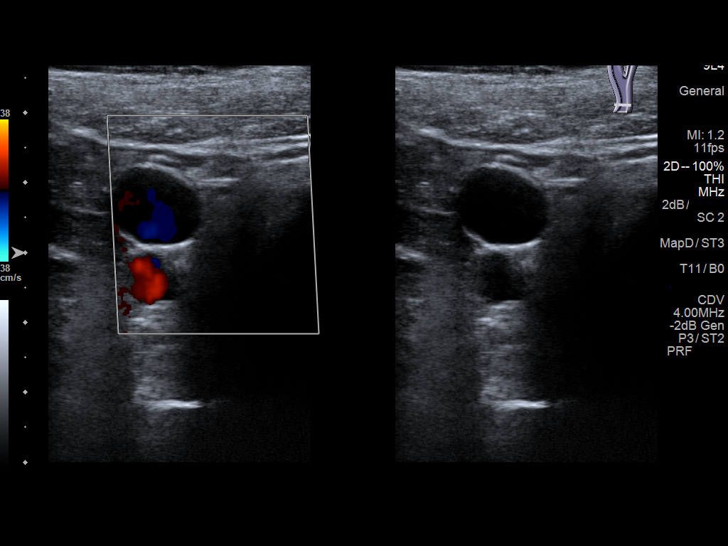
[im 6/64]
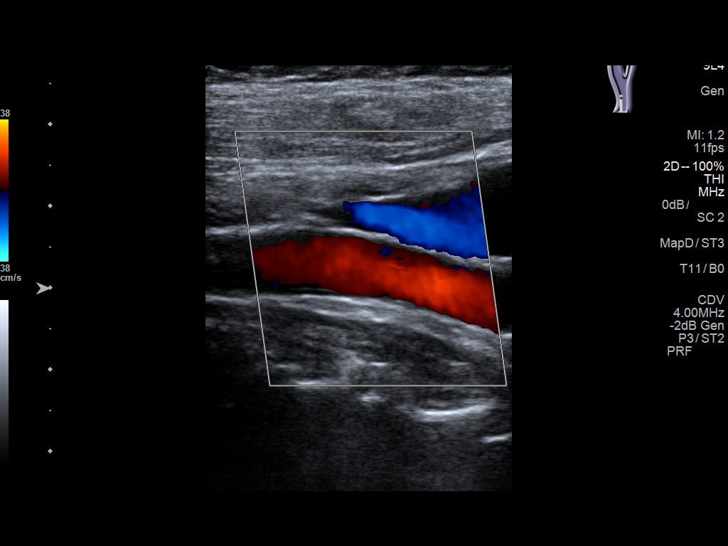
[im 11/64]
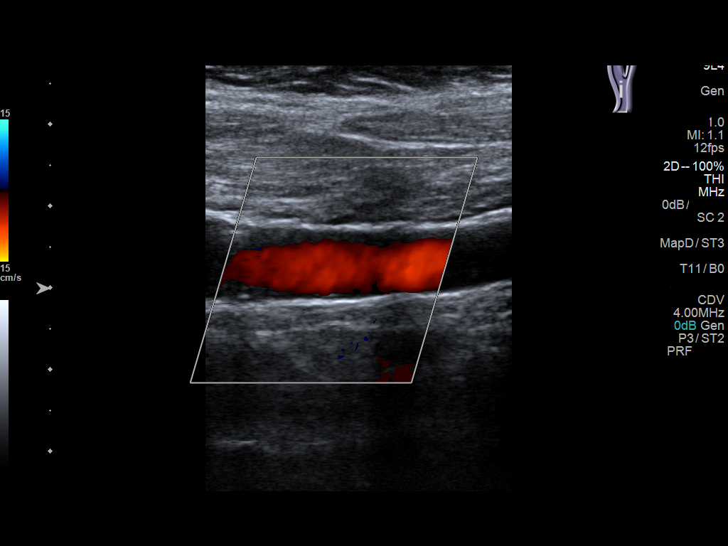
[im 17/64]
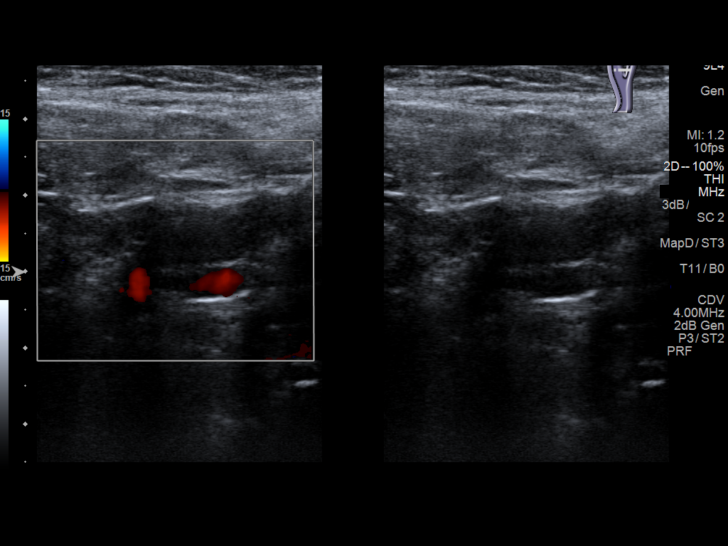
[im 22/64]
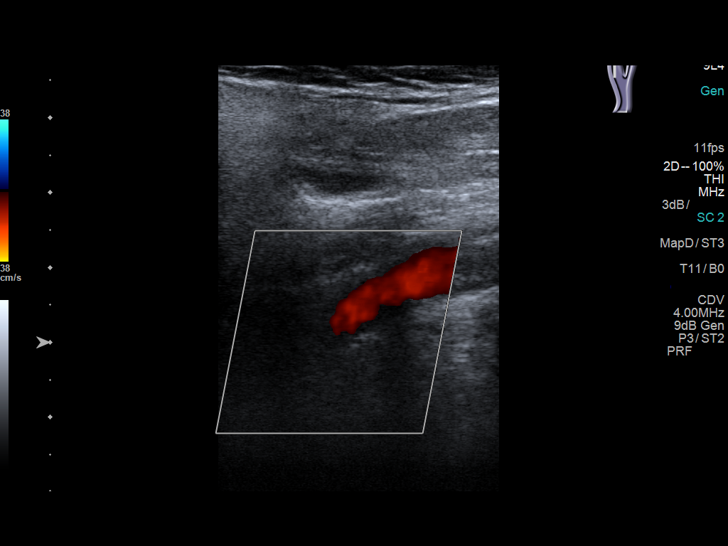
[im 28/64]
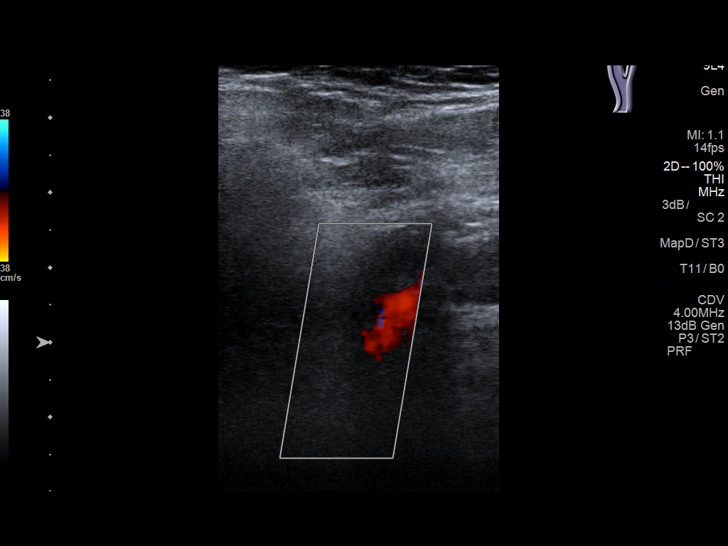
[im 33/64]
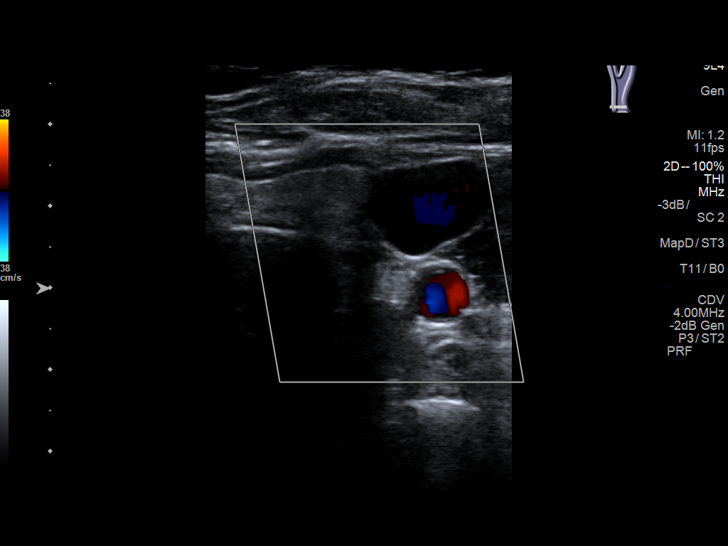
[im 36/64]
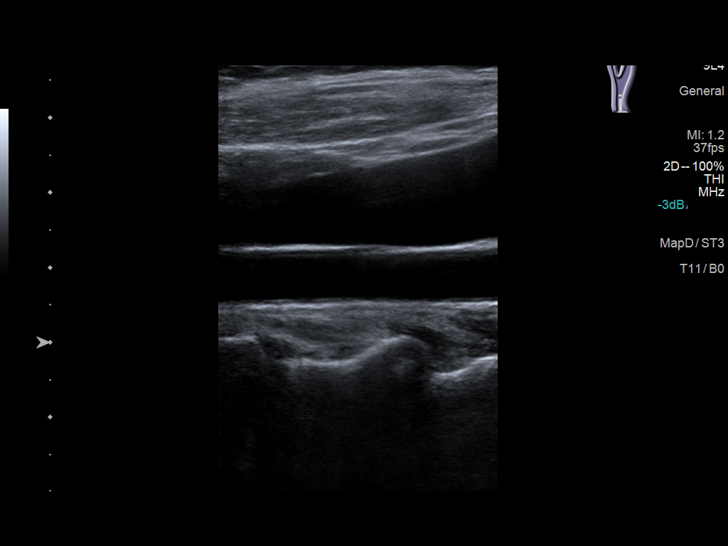
[im 42/64]
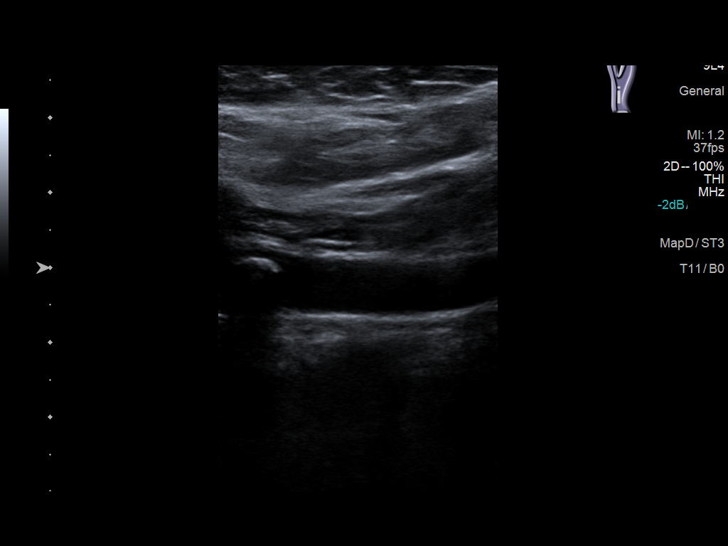
[im 47/64]
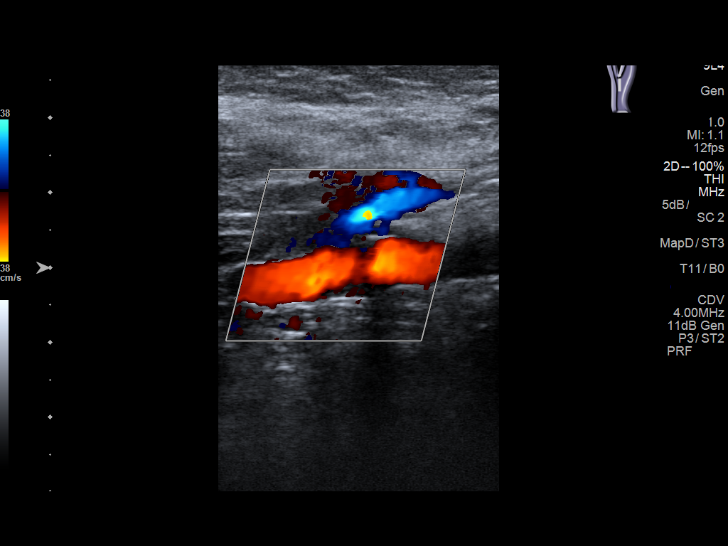
[im 53/64]
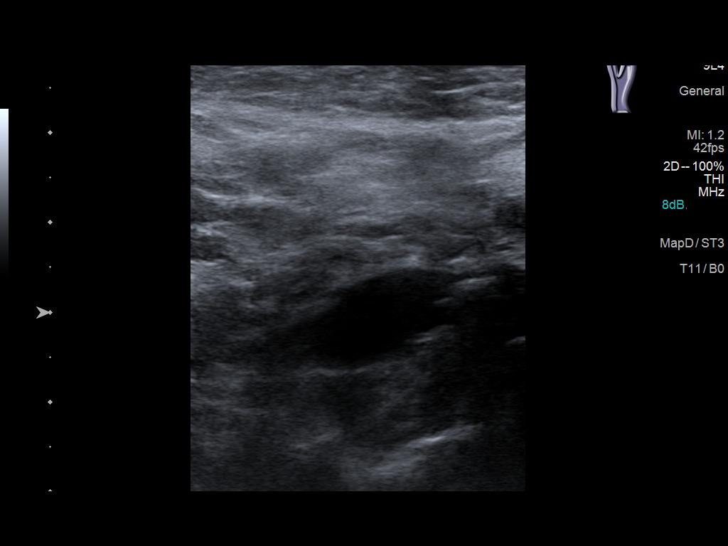
[im 58/64]
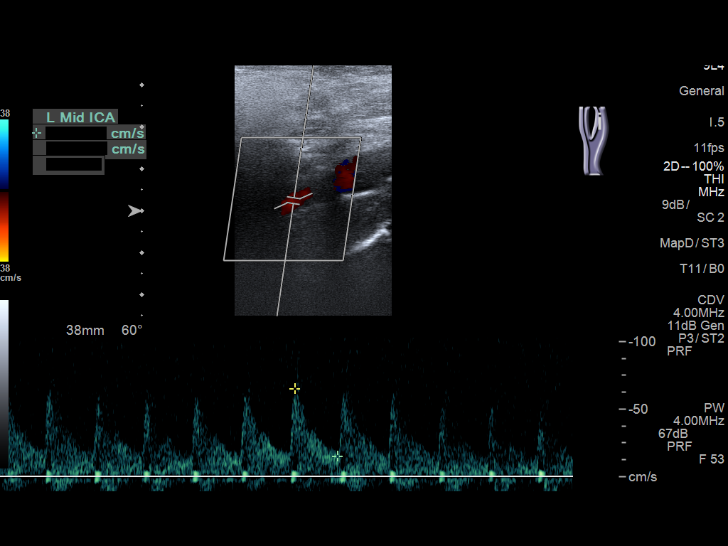
[im 64/64]
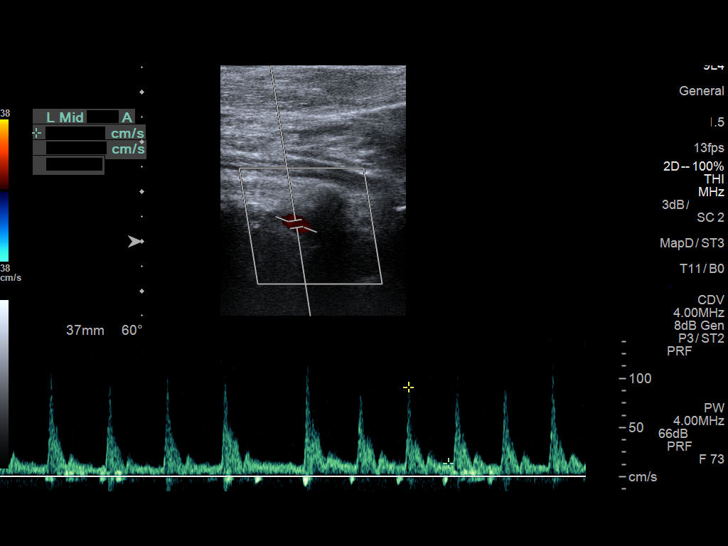

[13 of 24 positions shown; findings below may reference images not displayed]

FINDINGS: Criteria: Quantification of carotid stenosis is based on velocity
parameters that correlate the residual internal carotid diameter
with NASCET-based stenosis levels, using the diameter of the distal
internal carotid lumen as the denominator for stenosis measurement.

The following velocity measurements were obtained:

RIGHT

ICA:  Systolic 96 cm/sec, Diastolic 32 cm/sec

CCA:  127 cm/sec

SYSTOLIC ICA/CCA RATIO:

ECA:  132 cm/sec

LEFT

ICA:  Systolic 65 cm/sec, Diastolic 17 cm/sec

CCA:  122 cm/sec

SYSTOLIC ICA/CCA RATIO:

ECA:  118 cm/sec

Right Brachial SBP: Not acquired

Left Brachial SBP: Not acquired

RIGHT CAROTID ARTERY: No significant calcified disease of the right
common carotid artery. Intermediate waveform maintained.
Heterogeneous plaque without significant calcifications at the right
carotid bifurcation. Low resistance waveform of the right ICA. No
significant tortuosity.

RIGHT VERTEBRAL ARTERY: Antegrade flow with low resistance waveform.

LEFT CAROTID ARTERY: No significant calcified disease of the left
common carotid artery. Intermediate waveform maintained.
Heterogeneous plaque at the left carotid bifurcation without
significant calcifications. Low resistance waveform of the left ICA.

LEFT VERTEBRAL ARTERY:  Antegrade flow with low resistance waveform.
IMPRESSION: Color duplex indicates minimal heterogeneous plaque, with no
hemodynamically significant stenosis by duplex criteria in the
extracranial cerebrovascular circulation.

## 2019-04-02 IMAGING — CT CT NECK W/ CM
2 of 3 series · 7 of 14 positions shown, 8 images · IV contrast (iopamidol)
Comparison: None.

CLINICAL DATA: LEFT supraclavicular swelling for 6 months.

EXAM:
CT NECK WITH CONTRAST
TECHNIQUE: Multidetector CT imaging of the neck was performed using the
standard protocol following the bolus administration of intravenous
contrast.
CONTRAST:  75mL SZQML7-NUU IOPAMIDOL (SZQML7-NUU) INJECTION 61%

[Series 2: axial neck · axial · 0.52mm/px · z∈[-182,-50]mm · 3 of 133 slices shown]
[im 34/133  bone]
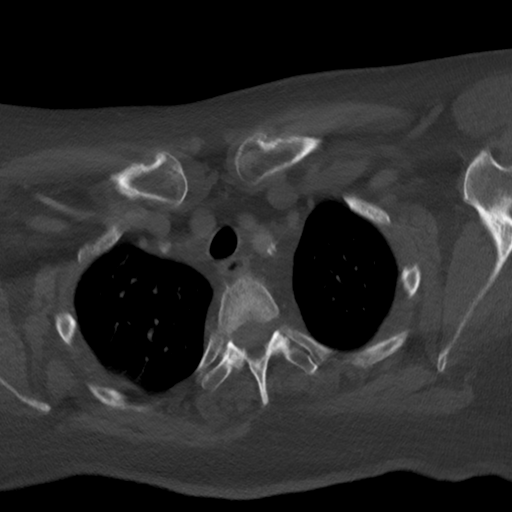
[im 67/133  bone]
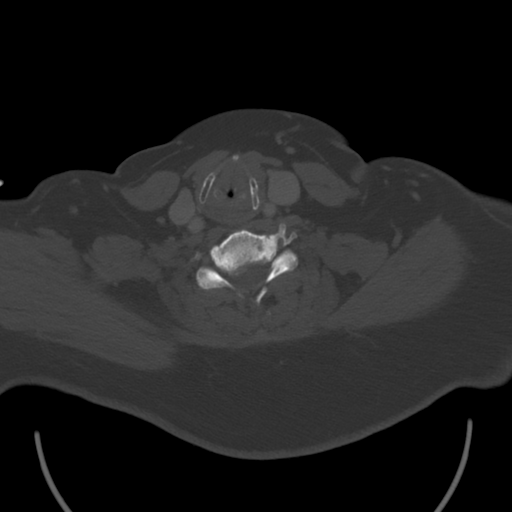
[im 100/133  bone]
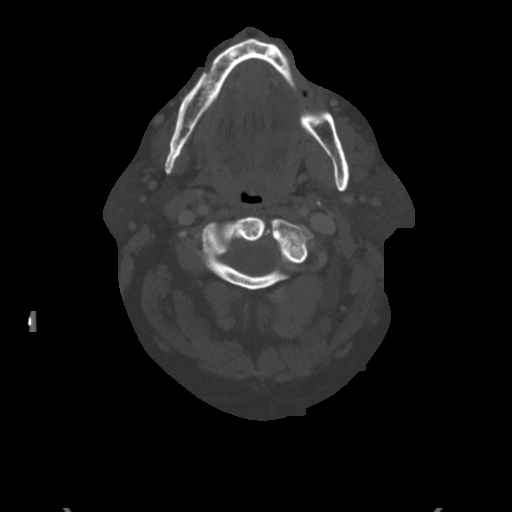

[Series 8: orthogonal ax · axial · 0.55mm/px · z∈[-243,-56]mm · 4 of 162 slices shown, 5 images]
[im 33/162  soft-tissue]
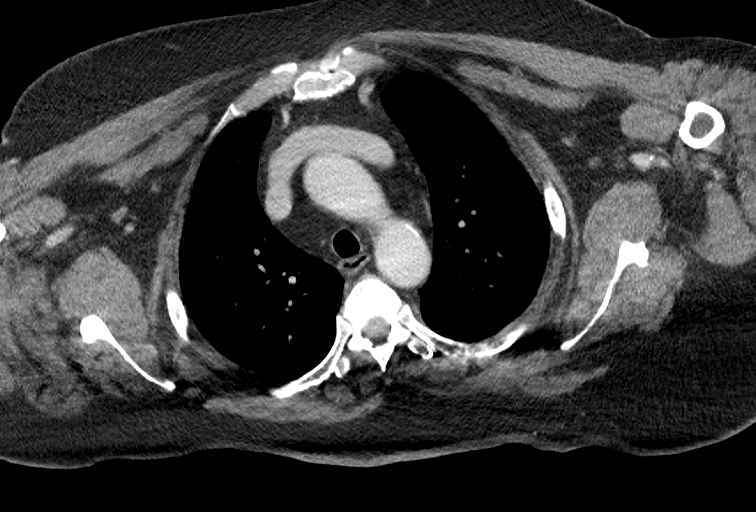
[im 33/162  bone]
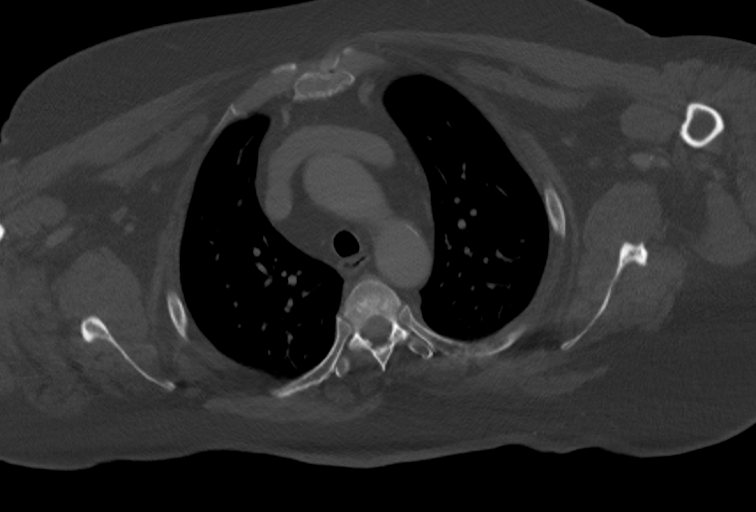
[im 65/162  bone]
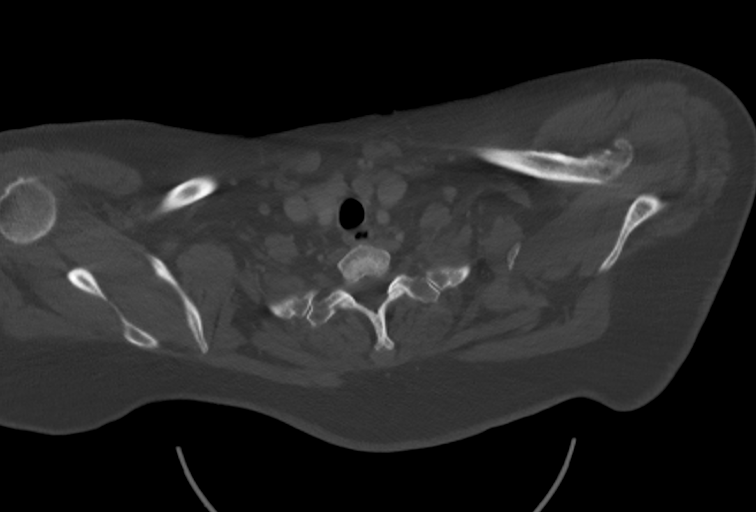
[im 97/162  bone]
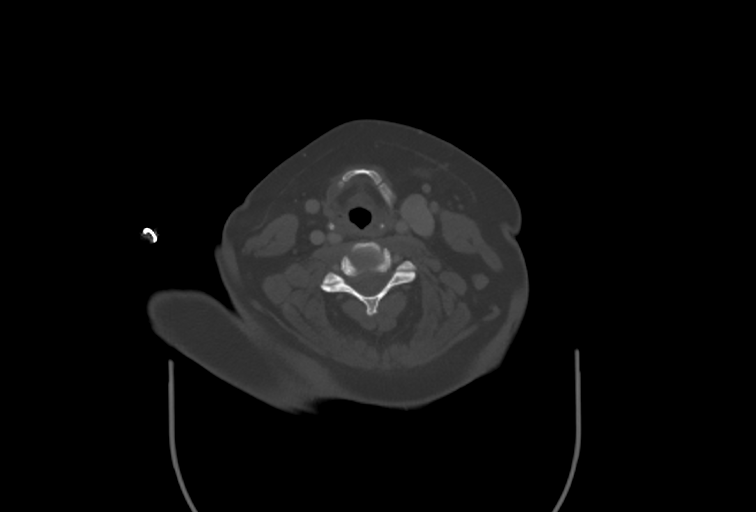
[im 129/162  bone]
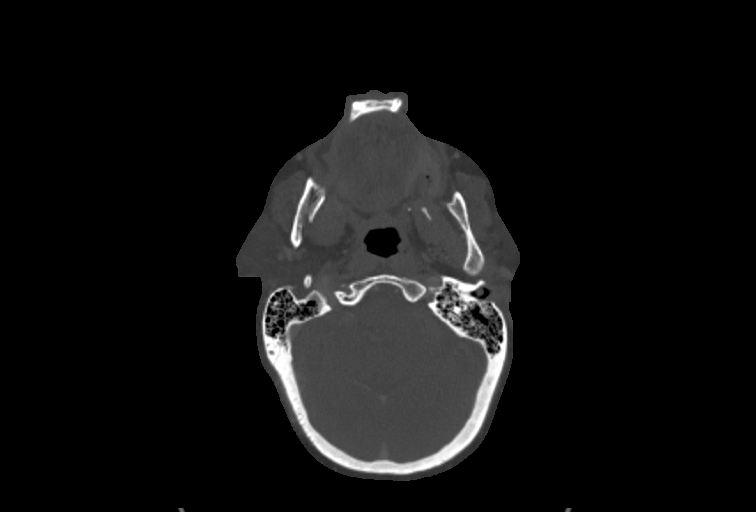

[7 of 14 positions shown; findings below may reference images not displayed]

FINDINGS: Pharynx and larynx: Normal. No mass or swelling.

Salivary glands: No inflammation, mass, or stone.

Thyroid: Normal.

Lymph nodes: None enlarged or abnormal density.

Vascular: Minor atheromatous change at the great vessel origins and
at the carotid bifurcations.

Limited intracranial: Negative.

Visualized orbits: Not assessed.

Mastoids and visualized paranasal sinuses: No fluid.

Skeleton: Mild spondylosis.

Upper chest: No nodules or pneumothorax

Other: The area of concern is marked with a vitamin-E capsule. There
is slight asymmetry of supraclavicular fat on the LEFT versus RIGHT.
See for instance coronal image 50 series 7. I attribute this to mild
scoliosis. There is no encapsulated abnormality to suggest lipoma,
and no inflammatory or other worrisome features.
IMPRESSION: No LEFT supraclavicular mass is evident. Slight asymmetry of
supraclavicular fat is attributed to mild scoliosis.

## 2019-09-02 MED ORDER — DEXTROSE 10 % IV SOLN
125.00 | INTRAVENOUS | Status: DC
Start: ? — End: 2019-09-02

## 2019-09-02 MED ORDER — PNEUMOCOCCAL VAC POLYVALENT 25 MCG/0.5ML IJ INJ
0.50 | INJECTION | INTRAMUSCULAR | Status: DC
Start: ? — End: 2019-09-02

## 2019-09-02 MED ORDER — ACETAMINOPHEN 325 MG PO TABS
650.00 | ORAL_TABLET | ORAL | Status: DC
Start: ? — End: 2019-09-02

## 2019-09-02 MED ORDER — GLUCOSE 40 % PO GEL
15.00 | ORAL | Status: DC
Start: ? — End: 2019-09-02

## 2019-09-02 MED ORDER — GLUCAGON (RDNA) 1 MG IJ KIT
1.00 | PACK | INTRAMUSCULAR | Status: DC
Start: ? — End: 2019-09-02

## 2019-09-02 MED ORDER — LISINOPRIL 20 MG PO TABS
20.00 | ORAL_TABLET | ORAL | Status: DC
Start: 2019-09-03 — End: 2019-09-02

## 2019-09-02 MED ORDER — INSULIN GLARGINE 100 UNIT/ML ~~LOC~~ SOLN
20.00 | SUBCUTANEOUS | Status: DC
Start: 2019-09-02 — End: 2019-09-02

## 2019-09-02 MED ORDER — ATORVASTATIN CALCIUM 10 MG PO TABS
10.00 | ORAL_TABLET | ORAL | Status: DC
Start: 2019-09-03 — End: 2019-09-02

## 2019-09-02 MED ORDER — INSULIN LISPRO 100 UNIT/ML ~~LOC~~ SOLN
2.00 | SUBCUTANEOUS | Status: DC
Start: 2019-09-02 — End: 2019-09-02

## 2019-09-02 MED ORDER — PAROXETINE HCL 20 MG PO TABS
40.00 | ORAL_TABLET | ORAL | Status: DC
Start: 2019-09-03 — End: 2019-09-02

## 2019-09-02 MED ORDER — NYSTATIN 100000 UNIT/GM EX POWD
CUTANEOUS | Status: DC
Start: 2019-09-02 — End: 2019-09-02

## 2019-09-02 MED ORDER — HEPARIN SODIUM (PORCINE) 5000 UNIT/ML IJ SOLN
5000.00 | INTRAMUSCULAR | Status: DC
Start: 2019-09-02 — End: 2019-09-02

## 2019-09-03 ENCOUNTER — Encounter: Payer: Self-pay | Admitting: Internal Medicine

## 2019-09-03 ENCOUNTER — Non-Acute Institutional Stay (SKILLED_NURSING_FACILITY): Payer: Medicare HMO | Admitting: Internal Medicine

## 2019-09-03 DIAGNOSIS — E118 Type 2 diabetes mellitus with unspecified complications: Secondary | ICD-10-CM

## 2019-09-03 DIAGNOSIS — R600 Localized edema: Secondary | ICD-10-CM

## 2019-09-03 DIAGNOSIS — G35 Multiple sclerosis: Secondary | ICD-10-CM

## 2019-09-03 DIAGNOSIS — N898 Other specified noninflammatory disorders of vagina: Secondary | ICD-10-CM

## 2019-09-03 DIAGNOSIS — I10 Essential (primary) hypertension: Secondary | ICD-10-CM | POA: Diagnosis not present

## 2019-09-03 NOTE — Progress Notes (Signed)
.  Location:    [facility] Room Number: 108/P Place of Service:  SNF [phone]) Provider:  Rae Lips, MD  Patient Care Team: Margit Hanks, MD as PCP - General (Internal Medicine)  Extended Emergency Contact Information Primary Emergency Contact: [facility] Address: [address]          [place], [place] [address] [place] Home Phone: [phone] Relation: Daughter  Code Status:  DNR Goals of care: Advanced Directive information Advanced Directives 09/03/2019  Does Patient Have a Medical Advance Directive? Yes  Type of Advance Directive Out of facility DNR (pink MOST or yellow form)  Does patient want to make changes to medical advance directive? No - Patient declined  Would patient like information on creating a medical advance directive? -  Pre-existing out of facility DNR order (yellow form or pink MOST form) Yellow form placed in chart (order not valid for inpatient use)     Chief Complaint  Patient presents with  . Hospitalization Follow-up    Hospitalization Follow Up    HPI:  Pt is a 69 y.o. female seen today.  For follow-up after hospitalization for suspected DKA.  Her other diagnoses include hypertension as well as hyperlipidemia depression multiple sclerosis.  She presented to the emergency room with increased weakness polyuria poor appetite apparently had not been taking her insulin.  Labs were suspicious for DKA and protocol was followed with transition to basal bolus insulin which achieved adequate glycemic control.  Her hemoglobin A1c was 7.9.  She also had a rash of skin folds of the groin area which was treated with antifungals.  Initially her urinalysis was suspicious for UTI and she was treated with Rocephin and Diflucan but this was eventually discontinued.  Her creatinine also was slightly elevated this was thought secondary to dehydration and improved.  Her lisinopril was  restarted.  She also was noted to have a stage II pressure ulcer on her right buttocks and this was followed by wound care and will be followed here as well.  She also before discharge had a greenish vaginal discharge-culture showed normal vaginal flora but since she was incontinent of urine with ulceration on the back she was discharged on Cipro.  She is here for rehab and will need follow-up with primary care provider and endocrinology upon discharge.  Currently she is resting in bed comfortably she has no complaints she appears to be in very good spirits.  Vital signs appear to be stable blood sugar this morning was 218- She has orders for Toujeo 50 units twice daily with NovoLog sliding scale   Past Medical History:  Diagnosis Date  . Diabetes mellitus without complication (HCC)    Pt takes insulin.  [name] Hypertension   . Multiple sclerosis (HCC)    No past surgical history on file.  No Known Allergies  Allergies as of 09/03/2019   No Known Allergies     Medication List       Accurate as of September 03, 2019 11:26 AM. If you have any questions, ask your nurse or doctor.        STOP taking these medications   acetaminophen 325 MG tablet Commonly known as: TYLENOL Stopped by: Edmon Crape, PA-C   amLODipine 10 MG tablet Commonly known as: NORVASC Stopped by: Edmon Crape, PA-C   Aubagio 14 MG Tabs Generic drug: Teriflunomide Stopped by: Edmon Crape, PA-C   Trulicity 1.5 MG/0.5ML Sopn Generic  drug: Dulaglutide Stopped by: Edmon Crape, PA-C   Welchol 625 MG tablet Generic drug: colesevelam Stopped by: Edmon Crape, PA-C     TAKE these medications   atorvastatin 40 MG tablet Commonly known as: LIPITOR Take 1 tablet by mouth daily.   bisacodyl 10 MG suppository Commonly known as: DULCOLAX If not relieved by MOM, give 10 mg Bisacodyl suppositiory rectally X 1 dose in 24 hours as needed (Do not use constipation standing orders for residents with renal failure/CFR  less than 30. Contact MD for orders) (Physician Order)   ciprofloxacin 500 MG tablet Commonly known as: CIPRO Take 500 mg by mouth 2 (two) times daily. For Greenish Vaginal discharge   dalfampridine 10 MG Tb12 Take 10 mg by mouth every 12 (twelve) hours. For MS   lisinopril 20 MG tablet Commonly known as: ZESTRIL Take 1 tablet by mouth daily.   magnesium hydroxide 400 MG/5ML suspension Commonly known as: MILK OF MAGNESIA If no BM in 3 days, give 30 cc Milk of Magnesium p.o. x 1 dose in 24 hours as needed (Do not use standing constipation orders for residents with renal failure CFR less than 30. Contact MD for orders) (Physician Order)   NON FORMULARY DIET :Regular NAS,CCD   NovoLOG FlexPen 100 UNIT/ML FlexPen Generic drug: insulin aspart AC with SSI 70-120=0 ,121-150=1unit,151-200=2units,201-250=3units,251-300=5units,301-350=7units,351-400=9 units greater than 400 call MD and give 9 units   PARoxetine 40 MG tablet Commonly known as: PAXIL Take 1 tablet by mouth daily.   RA SALINE ENEMA RE If not relieved by Biscodyl suppository, give disposable Saline Enema rectally X 1 dose/24 hrs as needed (Do not use constipation standing orders for residents with renal failure/CFR less than 30. Contact MD for orders)(Physician Or   Toujeo SoloStar 300 UNIT/ML Sopn Generic drug: Insulin Glargine (1 Unit Dial) Inject 50 Units into the skin 2 (two) times daily.       Review of Systems   In general she is not complaining of any fever or chills.  Skin does not complain of rashes at this time or itching was treated for rash in hospital.  Head ears eyes nose mouth and throat does not complain of visual changes or sore throat.  Respiratory does not complain of being short of breath or having cough.  Cardiac does not complain of chest pain or increased lower extremity edema from baseline she does have some increased right arm edema.  GI is not complaining of abdominal pain nausea vomiting  diarrhea constipation.  GU is not complaining of any dysuria at this point.  Musculoskeletal does not complain of joint pain does have some lower extremity weakness.  Neurologic positive for weakness does not complain of dizziness headache or syncope.  And psych does not complain of being depressed or anxious appears to be in good spirits does have a history of depression she is on Paxil.    Immunization History  Administered Date(s) Administered  . Influenza-Unspecified 04/09/2019  . Pneumococcal Polysaccharide-23 05/12/2017   Pertinent  Health Maintenance Due  Topic Date Due  . FOOT EXAM  04/25/1960  . OPHTHALMOLOGY EXAM  04/25/1960  . MAMMOGRAM  04/25/2000  . COLONOSCOPY  04/25/2000  . DEXA SCAN  04/26/2015  . HEMOGLOBIN A1C  11/08/2017  . PNA vac Low Risk Adult (2 of 2 - PCV13) 05/12/2018  . INFLUENZA VACCINE  Completed   No flowsheet data found. Functional Status Survey:    Vitals:   09/03/19 1105  BP: 122/71  Pulse: 76  Resp: 18  Temp: (!) 97 F (36.1 C)  TempSrc: Oral  SpO2: 99%  Weight: 272 lb (123.4 kg)  Height: 5\' 10"  (1.778 m)   Body mass index is 39.03 kg/m. Physical Exam In general this is a very pleasant elderly female in no distress lying comfortably in bed she is bright and alert.  Her skin is warm and dry-buttocks wound was not assessed secondary to patient positioning  Eyes visual acuity appears to be intact sclera and conjunctive are clear.  Oropharynx is clear mucous membranes moist.  Chest is clear to auscultation there is no labored breathing.  Heart is regular rate and rhythm without murmur gallop or rub she does not have significant lower extremity edema-- she does have some edema of her right arm this is cool to touch nonerythematous nontender radial pulses intact  Abdomen is obese soft nontender with positive bowel sounds.  Musculoskeletal is able to move all extremities x4-limited exam secondary to being in bed-she does have the  increased right arm edema.  Neurologic is grossly intact her speech is clear cannot appreciate lateralizing findings.  Psych she is alert and oriented very pleasant and appropriate Labs reviewed:   Notable for WBC of 5.9 hemoglobin 12.7 platelets 200,000 on hospital discharge.  Sodium 135 potassium 3.6 BUN 14 creatinine 0.59.  Alkaline phosphatase 166 AST was 49 albumin was 2.7.   No results for input(s): NA, K, CL, CO2, GLUCOSE, BUN, CREATININE, CALCIUM, MG, PHOS in the last 8760 hours. No results for input(s): AST, ALT, ALKPHOS, BILITOT, PROT, ALBUMIN in the last 8760 hours. No results for input(s): WBC, NEUTROABS, HGB, HCT, MCV, PLT in the last 8760 hours. No results found for: TSH Lab Results  Component Value Date   HGBA1C 8.3 (H) 05/11/2017   Lab Results  Component Value Date   CHOL 129 05/11/2017   HDL 37 (L) 05/11/2017   LDLCALC 70 05/11/2017   TRIG 110 05/11/2017   CHOLHDL 3.5 05/11/2017    Significant Diagnostic Results in last 30 days:  No results found.  Assessment/Plan T #1 history of diabetic ketoacidosis-she had not been taking her insulin apparently presented with weakness polyuria-this appears to be resolved she continues on Toujeo 50 units twice daily as well as NovoLog sliding scale   At this point will monitor blood sugars.  2.  History of hypertension she is on lisinopril 20 mg a day blood pressure today 122/71 at this point will monitor.  3.  History of multiple sclerosis continues on teriflunomide 14 mg a day as well as dalfampridine 10 mg twice daily.  At this point will monitor.  4.-History of elevated creatinine in hospital this was thought secondary to dehydration-creatinine on discharge appear to be 0.59.  Will monitor periodically.  5.  History of vaginal discharge she was started on Cipro empirically 500 mg twice daily for 5 days-she does not complain of dysuria today at this point will monitor.  6.  History of stage II pressure ulcer  right buttocks this will be followed by wound care was not assessed today secondary to patient positioning.  7.  History of depression this appears stable patient is is bright alert in good spirits she is on Paxil 40 mg daily.  8.  History of hyperlipidemia not stated as uncontrolled she is on atorvastatin 10 mg a day.  9.  History of right arm edema-this appears to be more dependent fluid it is nonerythematous nontender cool to touch-radial pulses intact strength appears to be intact.  Will encourage arm  elevation and monitor.  [id number].--Of note greater than 35 minutes spent assessing patient-reviewing her chart and labs-and coordinating formulating a plan of care for numerous diagnoses-of note greater than 50% of time spent coordinating a plan of care with input as noted above

## 2019-09-04 ENCOUNTER — Non-Acute Institutional Stay (SKILLED_NURSING_FACILITY): Payer: Medicare HMO | Admitting: Internal Medicine

## 2019-09-04 ENCOUNTER — Encounter: Payer: Self-pay | Admitting: Internal Medicine

## 2019-09-04 DIAGNOSIS — I10 Essential (primary) hypertension: Secondary | ICD-10-CM

## 2019-09-04 DIAGNOSIS — E785 Hyperlipidemia, unspecified: Secondary | ICD-10-CM

## 2019-09-04 DIAGNOSIS — E872 Acidosis: Secondary | ICD-10-CM | POA: Diagnosis not present

## 2019-09-04 DIAGNOSIS — F32A Depression, unspecified: Secondary | ICD-10-CM

## 2019-09-04 DIAGNOSIS — E1169 Type 2 diabetes mellitus with other specified complication: Secondary | ICD-10-CM

## 2019-09-04 DIAGNOSIS — E1110 Type 2 diabetes mellitus with ketoacidosis without coma: Secondary | ICD-10-CM | POA: Diagnosis not present

## 2019-09-04 DIAGNOSIS — F329 Major depressive disorder, single episode, unspecified: Secondary | ICD-10-CM

## 2019-09-04 DIAGNOSIS — G35 Multiple sclerosis: Secondary | ICD-10-CM | POA: Diagnosis not present

## 2019-09-04 DIAGNOSIS — E8729 Other acidosis: Secondary | ICD-10-CM

## 2019-09-04 NOTE — Progress Notes (Signed)
: Provider:  Margit Hanks., MD Location:  [facility] Room Number: 108-P Place of Service:  SNF [phone])  PCP: Margit Hanks, MD Patient Care Team: Margit Hanks, MD as PCP - General (Internal Medicine)  Extended Emergency Contact Information Primary Emergency Contact: [facility] Address: [address]          [place], [place] [address] [place] of [place] Home Phone: [phone] Relation: Daughter     Allergies: Patient has no known allergies.  Chief Complaint  Patient presents with  . New Admit To SNF    New admission to [facility] SN[facility]     HPI: Patient is a 69 y.o. female with P MH MS, diabetes mellitus type 2 insulin-dependent, hypertension, hyperlipidemia, depression who presented to [facility] ED with 5 day duration of weakness polyuria for appetite and cessation of taking insulin and several weeks duration of rash in skin folds and groin area.  In the ED patient was found to have a high anion gap acidosis, glucose in the 200s, mild AKA.  Patient was admitted to [facility] from 2/18-24 for DKA.  She was treated with DKA protocol and transition to bolus insulin with adequate less than control.  His hemoglobin A1c was noted to be 7.9.  Patient's creatinine is slightly elevated which resolved.  Patient is admitted to skilled nursing facility for OT/PT.  While at skilled nursing facility patient will be followed for MS treated with teriflunomide and dalfampridine, hypertension treated with lisinopril and depression treated with Paxil.  Past Medical History:  Diagnosis Date  . Diabetes mellitus without complication (HCC)    Pt takes insulin.  [name] Hypertension   . Multiple sclerosis (HCC)     History reviewed. No pertinent surgical history.  Allergies as of 09/04/2019   No Known Allergies     Medication List       Accurate as of September 04, 2019 10:51 AM. If you have any questions, ask your nurse  or doctor.        atorvastatin 40 MG tablet Commonly known as: LIPITOR Take 1 tablet by mouth daily.   bisacodyl 10 MG suppository Commonly known as: DULCOLAX If not relieved by MOM, give 10 mg Bisacodyl suppositiory rectally X 1 dose in 24 hours as needed (Do not use constipation standing orders for residents with renal failure/CFR less than 30. Contact MD for orders) (Physician Order)   ciprofloxacin 500 MG tablet Commonly known as: CIPRO Take 500 mg by mouth 2 (two) times daily. For Greenish Vaginal discharge   dalfampridine 10 MG Tb12 Take 10 mg by mouth every 12 (twelve) hours. For MS   lisinopril 20 MG tablet Commonly known as: ZESTRIL Take 1 tablet by mouth daily.   magnesium hydroxide 400 MG/5ML suspension Commonly known as: MILK OF MAGNESIA If no BM in 3 days, give 30 cc Milk of Magnesium p.o. x 1 dose in 24 hours as needed (Do not use standing constipation orders for residents with renal failure CFR less than 30. Contact MD for orders) (Physician Order)   NON FORMULARY DIET :Regular NAS,CCD   NovoLOG FlexPen 100 UNIT/ML FlexPen Generic drug: insulin aspart AC with SSI 70-120=0 ,121-150=1unit,151-200=2units,201-250=3units,251-300=5units,301-350=7units,351-400=9 units greater than 400 call MD and give 9 units   PARoxetine 40 MG tablet Commonly known as: PAXIL Take 1 tablet by mouth daily.   RA SALINE ENEMA RE If not relieved by Biscodyl suppository, give disposable Saline Enema rectally X  1 dose/24 hrs as needed (Do not use constipation standing orders for residents with renal failure/CFR less than 30. Contact MD for orders)(Physician Or   Toujeo SoloStar 300 UNIT/ML Sopn Generic drug: Insulin Glargine (1 Unit Dial) Inject 50 Units into the skin 2 (two) times daily.       No orders of the defined types were placed in this encounter.   Immunization History  Administered Date(s) Administered  . Influenza-Unspecified 04/09/2019  . Pneumococcal  Polysaccharide-23 05/12/2017    Social History   Tobacco Use  . Smoking status: Never Smoker  . Smokeless tobacco: Never Used  Substance Use Topics  . Alcohol use: No    Family history is mother with cancer.  History reviewed. No pertinent family history.    Review of Systems  GENERAL:  no fevers, fatigue, appetite changes SKIN: No itching, or rash EYES: No eye pain, redness, discharge EARS: No earache, tinnitus, change in hearing NOSE: No congestion, drainage or bleeding  MOUTH/THROAT: No mouth or tooth pain, No sore throat RESPIRATORY: No cough, wheezing, SOB CARDIAC: No chest pain, palpitations, lower extremity edema  GI: No abdominal pain, No N/V/D or constipation, No heartburn or reflux  GU: No dysuria, frequency or urgency, or incontinence  MUSCULOSKELETAL: No unrelieved bone/joint pain NEUROLOGIC: No headache, dizziness or focal weakness PSYCHIATRIC: No c/o anxiety or sadness   Vitals:   09/04/19 1043  BP: 119/68  Pulse: 84  Resp: 18  Temp: (!) 97.2 F (36.2 C)    SpO2 Readings from Last 1 Encounters:  09/03/19 99%   Body mass index is 33.35 kg/m.     Physical Exam  GENERAL APPEARANCE: Alert, conversant,  No acute distress.  SKIN: No diaphoresis rash HEAD: Normocephalic, atraumatic  EYES: Conjunctiva/lids clear. Pupils round, reactive. EOMs intact.  EARS: External exam WNL, canals clear. Hearing grossly normal.  NOSE: No deformity or discharge.  MOUTH/THROAT: Lips w/o lesions  RESPIRATORY: Breathing is even, unlabored. Lung sounds are clear   CARDIOVASCULAR: Heart RRR no murmurs, rubs or gallops. No peripheral edema.   GASTROINTESTINAL: Abdomen is soft, non-tender, not distended w/ normal bowel sounds. GENITOURINARY: Bladder non tender, not distended  MUSCULOSKELETAL: No abnormal joints or musculature NEUROLOGIC:  Cranial nerves 2-12 grossly intact. Moves all extremities  PSYCHIATRIC: Mood and affect appropriate to situation, no behavioral  issues  Patient Active Problem List   Diagnosis Date Noted  . Pressure injury of skin 05/11/2017  . Stroke (HCC) 05/10/2017  . MS (multiple sclerosis) (HCC) 05/10/2017  . HTN (hypertension) 05/10/2017  . Diabetes (HCC) 05/10/2017      Labs reviewed: Basic Metabolic Panel:    Component Value Date/Time   NA 130 (L) 05/10/2017 1234   K 4.1 05/10/2017 1234   CL 99 (L) 05/10/2017 1234   CO2 21 (L) 05/10/2017 1234   GLUCOSE 290 (H) 05/10/2017 1234   BUN 14 05/10/2017 1234   CREATININE 0.80 07/10/2017 0948   CALCIUM 9.1 05/10/2017 1234   GFRNONAA >60 05/10/2017 1234   GFRAA >60 05/10/2017 1234    No results for input(s): NA, K, CL, CO2, GLUCOSE, BUN, CREATININE, CALCIUM, MG, PHOS in the last 8760 hours. Liver Function Tests: No results for input(s): AST, ALT, ALKPHOS, BILITOT, PROT, ALBUMIN in the last 8760 hours. No results for input(s): LIPASE, AMYLASE in the last 8760 hours. No results for input(s): AMMONIA in the last 8760 hours. CBC: No results for input(s): WBC, NEUTROABS, HGB, HCT, MCV, PLT in the last 8760 hours. Lipid No results for input(s): CHOL,  HDL, LDLCALC, TRIG in the last 8760 hours.  Cardiac Enzymes: No results for input(s): CKTOTAL, CKMB, CKMBINDEX, TROPONINI in the last 8760 hours. BNP: No results for input(s): BNP in the last 8760 hours. No results found for: [facility] Lab Results  Component Value Date   HGBA1C 8.3 (H) 05/11/2017   No results found for: TSH No results found for: VITAMINB12 No results found for: FOLATE No results found for: IRON, TIBC, FERRITIN  Imaging and Procedures obtained prior to SNF admission: MR CERVICAL SPINE WO CONTRAST  Result Date: 07/10/2017 CLINICAL DATA:  69 year old female with chronic multiple sclerosis. A study without and with contrast was planned, but the examination had to be discontinued prior to completion. No contrast was administered. EXAM: MRI CERVICAL AND THORACIC SPINE WITHOUT CONTRAST TECHNIQUE:  Multiplanar and multiecho pulse sequences of the cervical and thoracic spine, were obtained without intravenous contrast. COMPARISON:  Brain MRI 05/10/2017. FINDINGS: MRI CERVICAL SPINE FINDINGS Alignment: Mild reversal of cervical lordosis. associated subtle multilevel spondylolisthesis, including mild anterolisthesis at C3-C4. Vertebrae: Normal bone marrow signal aside from chronic degenerative endplate marrow signal changes most pronounced at C5-C6 and C6-C7. No marrow edema or evidence of acute osseous abnormality. Cord: There is T2 hyperintensity in the right dorsal hemicord at C2-C3 (series 6, image 4). No definite additional cervical spinal cord lesion. Overall cervical spinal cord volume appears maintained. See degenerative cervical spinal stenosis detailed below. Posterior Fossa, vertebral arteries, paraspinal tissues: Negative visible brainstem and cerebellum. Disc levels: C2-C3: Mild foraminal disc bulging and endplate spurring. No stenosis. C3-C4: Circumferential disc bulge eccentric to the right. Broad-based right greater than left foraminal disc and endplate spurring. No spinal stenosis. Moderate left and moderate to severe right C4 foraminal stenosis. C4-C5: Disc space loss. Circumferential disc bulge and endplate spurring with broad-based foraminal components greater on the right. Mild spinal stenosis. Possible mild spinal cord mass effect. Moderate to severe left and severe right C5 foraminal stenosis. C5-C6: Disc space loss with bulky circumferential disc bulge and endplate spurring. Broad-based right paracentral posterior component (series 7, image 17). Spinal stenosis with mild spinal cord mass effect. Severe left and moderate to severe right C6 foraminal stenosis. C6-C7: Disc space loss with circumferential disc osteophyte complex. Broad-based posterior and foraminal components. No significant spinal stenosis. Moderate to severe bilateral C7 foraminal stenosis. C7-T1:  Negative. MRI THORACIC  SPINE FINDINGS Thoracic spine segmentation:  Appears normal. Alignment:  Normal thoracic vertebral height and alignment. Vertebrae: Visualized bone marrow signal is within normal limits (benign vertebral body hemangioma in T4). No marrow edema or evidence of acute osseous abnormality. Cord: Suggestion of mild volume loss throughout the thoracic spinal cord. No definite focal thoracic spinal cord signal abnormality. The conus medullaris is mostly visible at T12-L1 and appears normal. Paraspinal and other soft tissues: Mediastinal lipomatosis. Otherwise negative visualized thoracic and upper abdominal viscera. Mild posterior subcutaneous edema in the visible upper lumbar spine. Disc levels: T1-T2: Negative. T2-T3: Mild facet and endplate spurring. Borderline to mild T2 foraminal stenosis greater on the right. T3-T4: Mild facet and endplate spurring. Mild right T3 foraminal stenosis. T4-T5: Broad-based central disc protrusion (series 15, image 14) without spinal or foraminal stenosis. T5-T6: Small right subarticular disc protrusion (series 15, image 17). No stenosis. T6-T7: Small left paracentral to subarticular disc protrusion (series 15, image 20). No associated stenosis. There is borderline to mild left T6 foraminal stenosis related to endplate spurring. T7-T8: Broad-based right subarticular disc protrusion (series 15, image 23). This contributes to borderline to mild right T7  foraminal stenosis otherwise related to facet and endplate spurring. T8-T9: Mild circumferential disc bulge and endplate spurring. Mild facet hypertrophy. Mild bilateral T8 foraminal stenosis. T9-T10: Mild circumferential disc bulge and endplate spurring. Mild facet hypertrophy. Mild bilateral T9 foraminal stenosis greater on the right. T10-T11: Circumferential disc bulge and endplate spurring. Moderate facet and mild to moderate ligament flavum hypertrophy. No spinal stenosis. Moderate left and mild right T10 neural foraminal stenosis.  T11-T12: Mild circumferential disc bulge.  No stenosis. T12-L1: Small central disc protrusion (series 15, image 39). No stenosis. IMPRESSION: 1. The examination had to be discontinued prior to contrast administration. 2. Suspected chronic demyelinating lesion in the right hemicord at C2-C3. Suggestion of volume loss throughout the thoracic spinal cord, but no other discrete spinal cord lesion or spinal cord signal abnormality identified. 3. Cervical and thoracic spine degeneration with no acute osseous abnormality. 4. Degenerative cervical spinal stenosis at C4-C5 and C5-C6 with mild spinal cord mass effect. Moderate or severe cervical neural foraminal stenosis at the bilateral C4, C5, C6, and C7 nerve levels. 5. No thoracic spinal stenosis. Moderate thoracic neural foraminal stenosis at the left T10 nerve level. Mild thoracic foraminal stenosis elsewhere. Electronically Signed   By: Genevie Ann M.D.   On: 07/10/2017 11:53   MR THORACIC SPINE WO CONTRAST  Result Date: 07/10/2017 CLINICAL DATA:  69 year old female with chronic multiple sclerosis. A study without and with contrast was planned, but the examination had to be discontinued prior to completion. No contrast was administered. EXAM: MRI CERVICAL AND THORACIC SPINE WITHOUT CONTRAST TECHNIQUE: Multiplanar and multiecho pulse sequences of the cervical and thoracic spine, were obtained without intravenous contrast. COMPARISON:  Brain MRI 05/10/2017. FINDINGS: MRI CERVICAL SPINE FINDINGS Alignment: Mild reversal of cervical lordosis. associated subtle multilevel spondylolisthesis, including mild anterolisthesis at C3-C4. Vertebrae: Normal bone marrow signal aside from chronic degenerative endplate marrow signal changes most pronounced at C5-C6 and C6-C7. No marrow edema or evidence of acute osseous abnormality. Cord: There is T2 hyperintensity in the right dorsal hemicord at C2-C3 (series 6, image 4). No definite additional cervical spinal cord lesion. Overall  cervical spinal cord volume appears maintained. See degenerative cervical spinal stenosis detailed below. Posterior Fossa, vertebral arteries, paraspinal tissues: Negative visible brainstem and cerebellum. Disc levels: C2-C3: Mild foraminal disc bulging and endplate spurring. No stenosis. C3-C4: Circumferential disc bulge eccentric to the right. Broad-based right greater than left foraminal disc and endplate spurring. No spinal stenosis. Moderate left and moderate to severe right C4 foraminal stenosis. C4-C5: Disc space loss. Circumferential disc bulge and endplate spurring with broad-based foraminal components greater on the right. Mild spinal stenosis. Possible mild spinal cord mass effect. Moderate to severe left and severe right C5 foraminal stenosis. C5-C6: Disc space loss with bulky circumferential disc bulge and endplate spurring. Broad-based right paracentral posterior component (series 7, image 17). Spinal stenosis with mild spinal cord mass effect. Severe left and moderate to severe right C6 foraminal stenosis. C6-C7: Disc space loss with circumferential disc osteophyte complex. Broad-based posterior and foraminal components. No significant spinal stenosis. Moderate to severe bilateral C7 foraminal stenosis. C7-T1:  Negative. MRI THORACIC SPINE FINDINGS Thoracic spine segmentation:  Appears normal. Alignment:  Normal thoracic vertebral height and alignment. Vertebrae: Visualized bone marrow signal is within normal limits (benign vertebral body hemangioma in T4). No marrow edema or evidence of acute osseous abnormality. Cord: Suggestion of mild volume loss throughout the thoracic spinal cord. No definite focal thoracic spinal cord signal abnormality. The conus medullaris is mostly visible at  T12-L1 and appears normal. Paraspinal and other soft tissues: Mediastinal lipomatosis. Otherwise negative visualized thoracic and upper abdominal viscera. Mild posterior subcutaneous edema in the visible upper lumbar  spine. Disc levels: T1-T2: Negative. T2-T3: Mild facet and endplate spurring. Borderline to mild T2 foraminal stenosis greater on the right. T3-T4: Mild facet and endplate spurring. Mild right T3 foraminal stenosis. T4-T5: Broad-based central disc protrusion (series 15, image 14) without spinal or foraminal stenosis. T5-T6: Small right subarticular disc protrusion (series 15, image 17). No stenosis. T6-T7: Small left paracentral to subarticular disc protrusion (series 15, image 20). No associated stenosis. There is borderline to mild left T6 foraminal stenosis related to endplate spurring. T7-T8: Broad-based right subarticular disc protrusion (series 15, image 23). This contributes to borderline to mild right T7 foraminal stenosis otherwise related to facet and endplate spurring. T8-T9: Mild circumferential disc bulge and endplate spurring. Mild facet hypertrophy. Mild bilateral T8 foraminal stenosis. T9-T10: Mild circumferential disc bulge and endplate spurring. Mild facet hypertrophy. Mild bilateral T9 foraminal stenosis greater on the right. T10-T11: Circumferential disc bulge and endplate spurring. Moderate facet and mild to moderate ligament flavum hypertrophy. No spinal stenosis. Moderate left and mild right T10 neural foraminal stenosis. T11-T12: Mild circumferential disc bulge.  No stenosis. T12-L1: Small central disc protrusion (series 15, image 39). No stenosis. IMPRESSION: 1. The examination had to be discontinued prior to contrast administration. 2. Suspected chronic demyelinating lesion in the right hemicord at C2-C3. Suggestion of volume loss throughout the thoracic spinal cord, but no other discrete spinal cord lesion or spinal cord signal abnormality identified. 3. Cervical and thoracic spine degeneration with no acute osseous abnormality. 4. Degenerative cervical spinal stenosis at C4-C5 and C5-C6 with mild spinal cord mass effect. Moderate or severe cervical neural foraminal stenosis at the  bilateral C4, C5, C6, and C7 nerve levels. 5. No thoracic spinal stenosis. Moderate thoracic neural foraminal stenosis at the left T10 nerve level. Mild thoracic foraminal stenosis elsewhere. Electronically Signed   By: Odessa Fleming M.D.   On: 07/10/2017 11:53     Not all labs, radiology exams or other studies done during hospitalization come through on my EPIC note; however they are reviewed by me.    Assessment and Plan  DKA/diabetes mellitus type 2 -patient presented with an anion gap of 25, bicarb of 9 and a glucose of 228 lactic acid normal-DKA protocol then transition to basal bolus insulin; A1c 7.9, blood sugars around the 200s. SNF-admitted for OT/PT; continue regimen started in the hospital which is NovoLog 12 units 3 times daily and Toujeo 50 units in the morning and at bedtime, directly tied 0.75 mg subcu weekly; is on ACE and statin  Multiple sclerosis SNF-appears controlled; continue dalfampridine 2 mg ER 1 p.o. every 12 hours and teriflunomide 14 mg daily  Hypertension SNF-controlled; continue lisinopril 20 mg daily  Depression SNF-appears controlled; continue Paxil 40 mg daily  Hyperlipidemia SNF-not stated as uncontrolled; continue Lipitor 10 mg daily   Time spent greater than 45 minutes;> 50% of time with patient was spent reviewing records, labs, tests and studies, counseling and developing plan of care  Margit Hanks, MD

## 2019-09-05 ENCOUNTER — Encounter: Payer: Self-pay | Admitting: Internal Medicine

## 2019-09-05 DIAGNOSIS — E785 Hyperlipidemia, unspecified: Secondary | ICD-10-CM | POA: Insufficient documentation

## 2019-09-05 DIAGNOSIS — E1110 Type 2 diabetes mellitus with ketoacidosis without coma: Secondary | ICD-10-CM | POA: Insufficient documentation

## 2019-09-05 DIAGNOSIS — F329 Major depressive disorder, single episode, unspecified: Secondary | ICD-10-CM | POA: Insufficient documentation

## 2019-09-05 DIAGNOSIS — E1169 Type 2 diabetes mellitus with other specified complication: Secondary | ICD-10-CM | POA: Insufficient documentation

## 2019-09-05 DIAGNOSIS — F32A Depression, unspecified: Secondary | ICD-10-CM | POA: Insufficient documentation

## 2019-09-05 DIAGNOSIS — E872 Acidosis: Secondary | ICD-10-CM | POA: Insufficient documentation

## 2019-09-05 DIAGNOSIS — E8729 Other acidosis: Secondary | ICD-10-CM | POA: Insufficient documentation

## 2019-09-08 ENCOUNTER — Encounter: Payer: Self-pay | Admitting: Internal Medicine

## 2019-09-08 ENCOUNTER — Non-Acute Institutional Stay (SKILLED_NURSING_FACILITY): Payer: Medicare HMO | Admitting: Internal Medicine

## 2019-09-08 DIAGNOSIS — E1169 Type 2 diabetes mellitus with other specified complication: Secondary | ICD-10-CM

## 2019-09-08 DIAGNOSIS — E1110 Type 2 diabetes mellitus with ketoacidosis without coma: Secondary | ICD-10-CM | POA: Diagnosis not present

## 2019-09-08 DIAGNOSIS — F329 Major depressive disorder, single episode, unspecified: Secondary | ICD-10-CM

## 2019-09-08 DIAGNOSIS — I10 Essential (primary) hypertension: Secondary | ICD-10-CM

## 2019-09-08 DIAGNOSIS — E785 Hyperlipidemia, unspecified: Secondary | ICD-10-CM

## 2019-09-08 DIAGNOSIS — F32A Depression, unspecified: Secondary | ICD-10-CM

## 2019-09-08 DIAGNOSIS — G35 Multiple sclerosis: Secondary | ICD-10-CM

## 2019-09-08 DIAGNOSIS — E118 Type 2 diabetes mellitus with unspecified complications: Secondary | ICD-10-CM | POA: Diagnosis not present

## 2019-09-08 MED ORDER — PAROXETINE HCL 40 MG PO TABS: 40.0000 mg | ORAL_TABLET | Freq: Every day | ORAL | 0 refills | Status: DC

## 2019-09-08 MED ORDER — LISINOPRIL 20 MG PO TABS: 20.0000 mg | ORAL_TABLET | Freq: Every day | ORAL | 0 refills | Status: DC

## 2019-09-08 MED ORDER — NOVOLOG FLEXPEN 100 UNIT/ML ~~LOC~~ SOPN: 1.0000 [IU] | PEN_INJECTOR | SUBCUTANEOUS | 0 refills | Status: DC | PRN

## 2019-09-08 MED ORDER — TOUJEO SOLOSTAR 300 UNIT/ML ~~LOC~~ SOPN: 50.0000 [IU] | PEN_INJECTOR | Freq: Two times a day (BID) | SUBCUTANEOUS | 0 refills | Status: DC

## 2019-09-08 MED ORDER — AUBAGIO 14 MG PO TABS: 1.0000 | ORAL_TABLET | Freq: Every day | ORAL | 0 refills | Status: DC

## 2019-09-08 MED ORDER — NYSTATIN 100000 UNIT/GM EX POWD: 1.0000 "application " | Freq: Every day | CUTANEOUS | 0 refills | Status: DC

## 2019-09-08 MED ORDER — ATORVASTATIN CALCIUM 10 MG PO TABS: 10.0000 mg | ORAL_TABLET | Freq: Every day | ORAL | 0 refills | Status: DC

## 2019-09-08 MED ORDER — FLUCONAZOLE 100 MG PO TABS: 100.0000 mg | ORAL_TABLET | Freq: Every day | ORAL | 0 refills | Status: AC

## 2019-09-08 MED ORDER — DALFAMPRIDINE ER 10 MG PO TB12: 10.0000 mg | ORAL_TABLET | Freq: Two times a day (BID) | ORAL | 0 refills | Status: DC

## 2019-09-08 MED ORDER — TRULICITY 0.75 MG/0.5ML ~~LOC~~ SOAJ: 0.7500 mg | SUBCUTANEOUS | 0 refills | Status: DC

## 2019-09-08 NOTE — Progress Notes (Signed)
Location:  [facility] Room Number: 108-P Place of Service:  SNF [phone])  Provider: Edmon Crape, PA-C  PCP: Margit Hanks, MD Patient Care Team: Margit Hanks, MD as PCP - General (Internal Medicine)  Extended Emergency Contact Information Primary Emergency Contact: [facility] Address: [address]          [place], [place] [address] D[place] of [place] Home Phone: [phone] Relation: Daughter  Code Status: DNR Goals of care:  Advanced Directive information Advanced Directives 09/08/2019  Does Patient Have a Medical Advance Directive? Yes  Type of Advance Directive Out of facility DNR (pink MOST or yellow form)  Does patient want to make changes to medical advance directive? No - Patient declined  Would patient like information on creating a medical advance directive? -  Pre-existing out of facility DNR order (yellow form or pink MOST form) Yellow form placed in chart (order not valid for inpatient use)     No Known Allergies  Chief Complaint  Patient presents with  . Discharge Note    Patient is seen for discharge from SNF    HPI:  69 y.o. female seen today for discharge from facility she will be leaving later today.  She has been here for short-term rehab after hospitalization for polyuria with increased weakness-apparently had not been taking her insulin.  Labs were suspicious for DKA protocol was followed with transition to basal bolus insulin and she achieved better glycemic control.  Hemoglobin A1c was 7.9.  She also had a rash of her skin folds in the groin area and that was treated with antifungals.  Initially was she was treated with Rocephin and Diflucan for concerns of a UTI but this was eventually discontinued. She has been started on a  short course of Diflucan here for her rash in the groin she will finish this on March 7  Her creatinine was mildly elevated this was stopped secondary to dehydration and did  improve --her creatinine on February 25 was 0.46.  Her lisinopril has been restarted.  She also had a stage II pressure ulcer on her right buttocks this was followed by wound care and has been followed here as well.  Before her hospital discharge she had a greenish vaginal charge culture showed normal vaginal flora but she was incontinent of urine with ulceration of her buttocks so she was discharged on Cipro.  Her stay here has been quite unremarkable blood sugars are somewhat elevated in the 200s at times-since she is about to go home however would be hesitant to increased her medications.  She will need follow-up by primary care provider and and would benefit from endocrinology as well.  She continues on Toujeo 50 units twice daily as well as NovoLog sliding scale.  She has no complaints today she is looking forward to going home-she lives in a seniors high-rise apartment and says she really enjoys it.  She will need PT OT as well as home health support.     Past Medical History:  Diagnosis Date  . Diabetes mellitus without complication (HCC)    Pt takes insulin.  [name] Hypertension   . Multiple sclerosis (HCC)     No past surgical history on file.    reports that she has never smoked. She has never used smokeless tobacco. She reports that she does not drink alcohol or use drugs. Social History   Socioeconomic History  . Marital status: Widowed    Spouse name: Not  on file  . Number of children: Not on file  . Years of education: Not on file  . Highest education level: Not on file  Occupational History  . Not on file  Tobacco Use  . Smoking status: Never Smoker  . Smokeless tobacco: Never Used  Substance and Sexual Activity  . Alcohol use: No  . Drug use: No  . Sexual activity: Never    Partners: Male  Other Topics Concern  . Not on file  Social History Narrative  . Not on file   Social Determinants of Health   Financial Resource Strain:   . Difficulty of  Paying Living Expenses: Not on file  Food Insecurity:   . Worried About Charity fundraiser in the Last Year: Not on file  . Ran Out of Food in the Last Year: Not on file  Transportation Needs:   . Lack of Transportation (Medical): Not on file  . Lack of Transportation (Non-Medical): Not on file  Physical Activity:   . Days of Exercise per Week: Not on file  . Minutes of Exercise per Session: Not on file  Stress:   . Feeling of Stress : Not on file  Social Connections:   . Frequency of Communication with Friends and Family: Not on file  . Frequency of Social Gatherings with Friends and Family: Not on file  . Attends Religious Services: Not on file  . Active Member of Clubs or Organizations: Not on file  . Attends Archivist Meetings: Not on file  . Marital Status: Not on file  Intimate Partner Violence:   . Fear of Current or Ex-Partner: Not on file  . Emotionally Abused: Not on file  . Physically Abused: Not on file  . Sexually Abused: Not on file   Functional Status Survey:    No Known Allergies  Pertinent  Health Maintenance Due  Topic Date Due  . FOOT EXAM  04/25/1960  . OPHTHALMOLOGY EXAM  04/25/1960  . MAMMOGRAM  04/25/2000  . COLONOSCOPY  04/25/2000  . DEXA SCAN  04/26/2015  . HEMOGLOBIN A1C  11/08/2017  . PNA vac Low Risk Adult (2 of 2 - PCV13) 08/09/2020  . INFLUENZA VACCINE  Completed    Medications: Outpatient Encounter Medications as of 09/08/2019  Medication Sig  . Amino Acids-Protein Hydrolys (FEEDING SUPPLEMENT, PRO-STAT SUGAR FREE 64,) LIQD Take 30 mLs by mouth 2 (two) times daily.  [name] atorvastatin (LIPITOR) 10 MG tablet Take 10 mg by mouth daily.  . bisacodyl (DULCOLAX) 10 MG suppository If not relieved by MOM, give 10 mg Bisacodyl suppositiory rectally X 1 dose in 24 hours as needed (Do not use constipation standing orders for residents with renal failure/CFR less than 30. Contact MD for orders) (Physician Order)  . dalfampridine 10 MG TB12  Take 10 mg by mouth every 12 (twelve) hours. For MS  . Dulaglutide (TRULICITY) 0.93 AT/5.5DD SOPN Inject 0.75 mg into the skin once a week.  . fluconazole (DIFLUCAN) 100 MG tablet Take 100 mg by mouth daily.  [name] lisinopril (PRINIVIL,ZESTRIL) 20 MG tablet Take 1 tablet by mouth daily.  . magnesium hydroxide (MILK OF MAGNESIA) 400 MG/5ML suspension If no BM in 3 days, give 30 cc Milk of Magnesium p.o. x 1 dose in 24 hours as needed (Do not use standing constipation orders for residents with renal failure CFR less than 30. Contact MD for orders) (Physician Order)  . NON FORMULARY DIET :Regular NAS,CCD  . NOVOLOG FLEXPEN 100 UNIT/ML FlexPen AC  with SSI 70-120=0 ,121-150=1unit,151-200=2units,201-250=3units,251-300=5units,301-350=7units,351-400=9 units greater than 400 call MD and give 9 units  . nystatin (NYSTATIN) powder Apply 1 application topically daily. Apply under abdominal fold, perineal area, and gluteal cleft daily.  [name] PARoxetine (PAXIL) 40 MG tablet Take 1 tablet by mouth daily.  . Sodium Phosphates (RA SALINE ENEMA RE) If not relieved by Biscodyl suppository, give disposable Saline Enema rectally X 1 dose/24 hrs as needed (Do not use constipation standing orders for residents with renal failure/CFR less than 30. Contact MD for orders)(Physician Or  . Teriflunomide (AUBAGIO) 14 MG TABS Take 1 tablet by mouth daily.  Nathen May SOLOSTAR 300 UNIT/ML SOPN Inject 50 Units into the skin 2 (two) times daily.  . [DISCONTINUED] atorvastatin (LIPITOR) 40 MG tablet Take 1 tablet by mouth daily.  . [DISCONTINUED] ciprofloxacin (CIPRO) 500 MG tablet Take 500 mg by mouth 2 (two) times daily. For Greenish Vaginal discharge   No facility-administered encounter medications on file as of 09/08/2019.    Review of Systems    General she is not complaining of any fever or chills.  Skin does not complain of itching.  Ulceration on buttocks apparently is stable per wound care-does also have a groin rash  Head  ears eyes nose mouth and throat is not complaining of any visual changes or sore throat.  Respiratory does not complain of being short of breath or having cough.  Cardiac does not complain of chest pain or increased edema-she did have some edema of her right arm but this appears to be improving.  GI is not complaining of abdominal discomfort nausea vomiting diarrhea constipation.  GU is not complaining of any dysuria she has been treated for vaginal discharge.  Musculoskeletal continues to have some weakness lower extremities but does not complain of pain.  Neurologic does not complain of dizziness headache numbness does have weakness.  And psych is not complaining of being depressed or anxious she is on Paxil continues to be in good spirits with an upbeat attitude    Vitals:   09/08/19 1226  BP: 106/62  Pulse: 88  Resp: 18  Temp: 98.7 F (37.1 C)  TempSrc: Oral  SpO2: 99%  Weight: 234 lb 6.4 oz (106.3 kg)  Height: 5\' 10"  (1.778 m)   Body mass index is 33.63 kg/m. Physical Exam In general this is a pleasant elderly female in no distress lying comfortably in bed.  Her skin is warm and dry biotics ulcer and groin rash was not assessed secondary to patient positioning-- this has been followed by wound care and thought to be stable.  Eyes visual acuity appears to be intact sclerae and conjunctive are clear.  Oropharynx is clear mucous membranes moist.  Chest is clear to auscultation there is no labored breathing.  Heart is regular rate and rhythm without murmur gallop or rub she has trace lower extremity edema more so over her feet-the edema on her right arm is still there but is improved it is cool nontender nonerythematous radial pulse is intact.  Abdomen is soft nontender obese with positive bowel sounds.  Musculoskeletal Limited exam since she is in bed but is able to move all extremities x4 and appears at relative baseline.  Neurologic is grossly intact her speech is  clear could not really appreciate lateralizing findings  Psych she is alert and oriented very pleasant and appropriate.   Labs reviewed:  September 03, 2019.  WBC 4.6 hemoglobin 11.9 platelets 122.  Sodium 134 potassium 4.2 BUN 10 creatinine 0.46  Basic Metabolic Panel: No results for input(s): NA, K, CL, CO2, GLUCOSE, BUN, CREATININE, CALCIUM, MG, PHOS in the last 8760 hours. Liver Function Tests: No results for input(s): AST, ALT, ALKPHOS, BILITOT, PROT, ALBUMIN in the last 8760 hours. No results for input(s): LIPASE, AMYLASE in the last 8760 hours. No results for input(s): AMMONIA in the last 8760 hours. CBC: No results for input(s): WBC, NEUTROABS, HGB, HCT, MCV, PLT in the last 8760 hours. Cardiac Enzymes: No results for input(s): CKTOTAL, CKMB, CKMBINDEX, TROPONINI in the last 8760 hours. BNP: Invalid input(s): POCBNP CBG: No results for input(s): GLUCAP in the last 8760 hours.  Procedures and Imaging Studies During Stay: No results found.  Assessment/Plan:   #1 history of diabetes ketoacidosis apparently had not been taking her insulin before her hospitalization-this appears resolved she is on Toujeo 50 units twice daily as well as NovoLog sliding scale as well as Trulicity 0.75 mg once a week -CBGs have been mildly elevated but would be hesitant to adjust medications right before discharge.  She will need expedient primary care follow-up as well as endocrinology.  2.-History of hypertension she is back on her lisinopril 20 mg a day blood pressures appear to be stable-blood pressure today 106/62-previously 122/71.  3.  History of multiple sclerosis she is on teriflunomide 14 mg a day as well as dalfampridine 10 mg twice daily.  #4-history of elevated creatinine this appears to be stabilized creatinine was 0.46 on lab done on February 25.  5.  History of vaginal discharge she has completed a course of Cipro-apparently this has been effective. She is on Diflucan with  the history of suspected yeast with a groin rash this will be in place until March 7  6.  History of pressure ulcer right buttocks this is been followed by wound care and thought to be stable this will need follow-up by home health.  7.  History of depression this appears quite stable she is bright alert interactive cheerful she is on Paxil 40 mg a day.  #8 hyperlipidemia continues on atorvastatin 10 mg a day not stated as uncontrolled will need follow-up by primary care provider  Consult  Patient is being discharged with the following home health services: PT OT and home health   Patient has been advised to f/u with their PCP in 1-2 weeks to for a transitions of care visit.  Social services at their facility was responsible for arranging this appointment.  Pt was provided with adequate prescriptions of noncontrolled medications to reach the scheduled appointment .  For controlled substances, a limited supply was provided as appropriate for the individual patient.  If the pt normally receives these medications from a pain clinic or has a contract with another physician, these medications should be received from that clinic or physician only).   * [id number] note  greater than 30 minutes spent on this discharge summary-greater than 50% of time spent coordinating a plan of care

## 2019-09-11 ENCOUNTER — Encounter: Payer: Self-pay | Admitting: Internal Medicine

## 2019-09-12 IMAGING — CT CT HEAD W/O CM
3 series · 14 of 47 positions shown, 16 images · non-contrast
Comparison: None.

CLINICAL DATA: Three-day history of right parietal region headache

EXAM:
CT HEAD WITHOUT CONTRAST
TECHNIQUE: Contiguous axial images were obtained from the base of the skull
through the vertex without intravenous contrast.

[Series 2: head wo · axial · 0.43mm/px · z∈[+392,+522]mm · 8 of 32 slices shown, 10 images]
[im 3/32  brain]
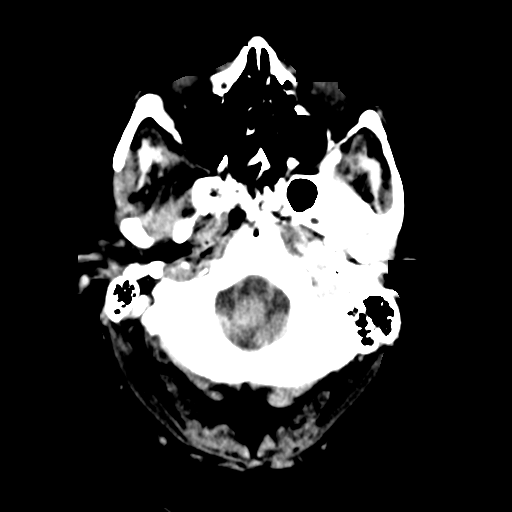
[im 3/32  bone]
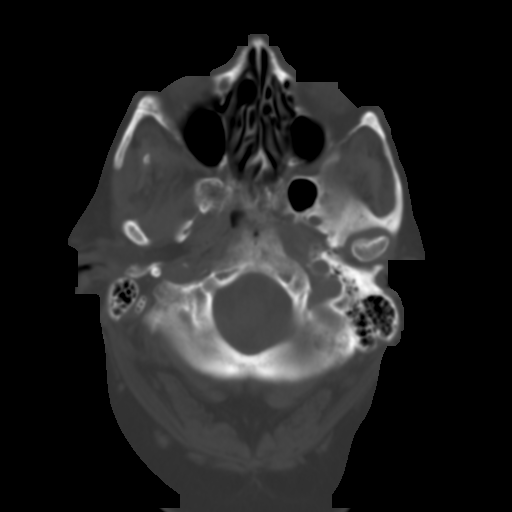
[im 7/32  brain]
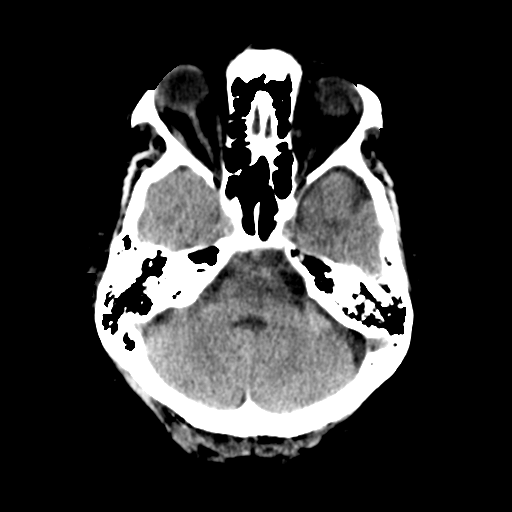
[im 10/32  brain]
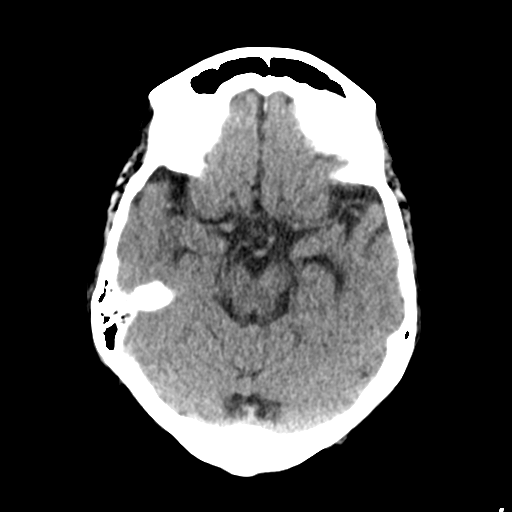
[im 14/32  brain]
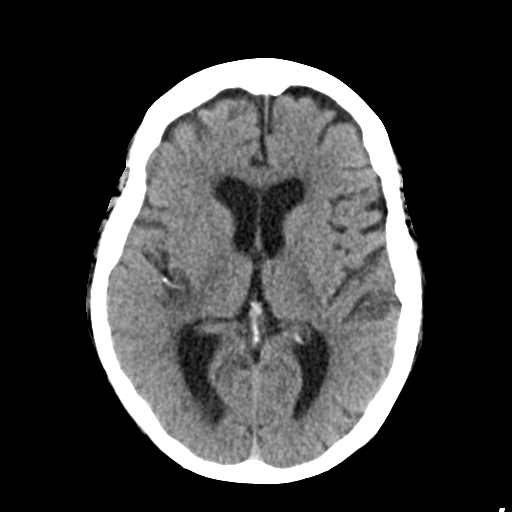
[im 18/32  brain]
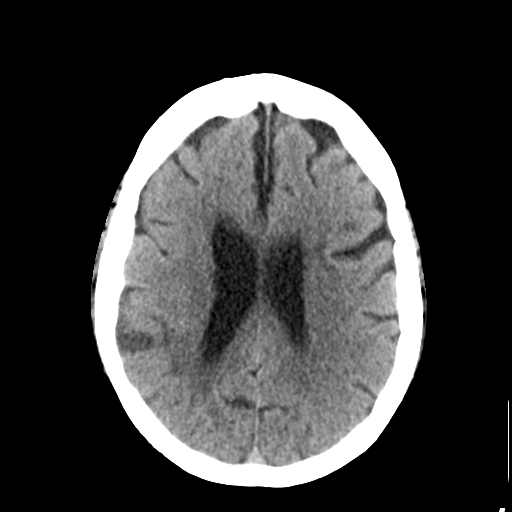
[im 18/32  bone]
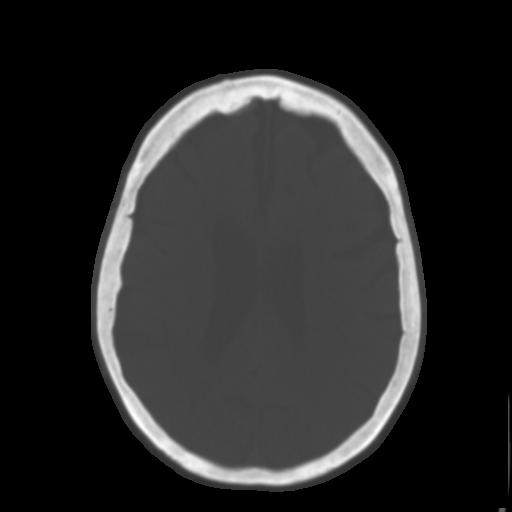
[im 22/32  brain]
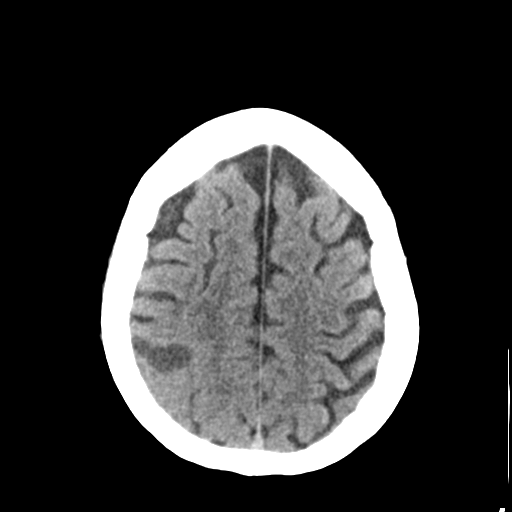
[im 25/32  brain]
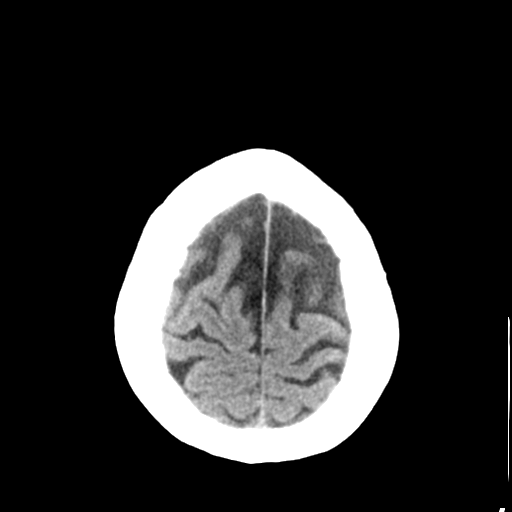
[im 29/32  brain]
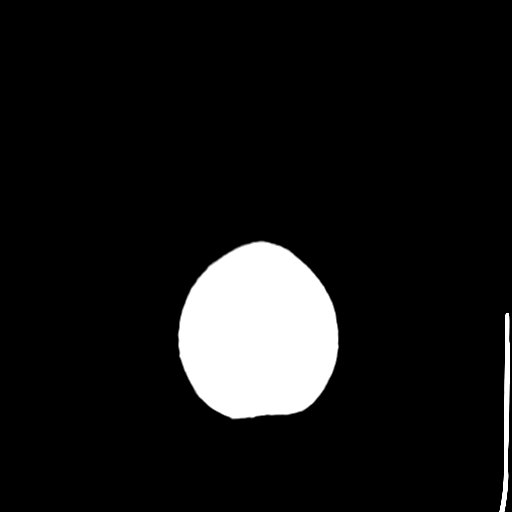

[Series 4: coronal soft tissue · coronal · 0.32mm/px · 3 of 67 slices shown]
[im 23/67  brain]
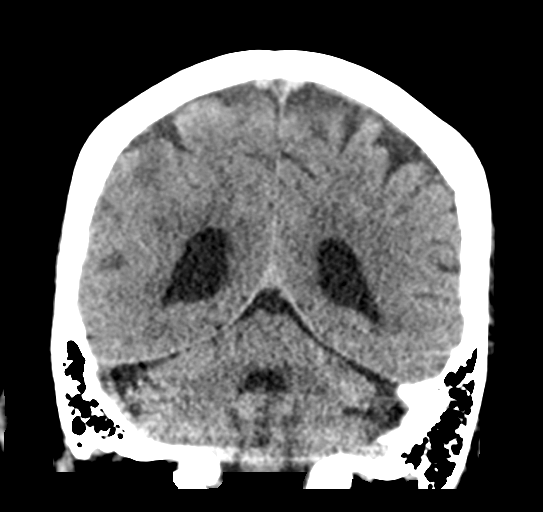
[im 30/67  brain]
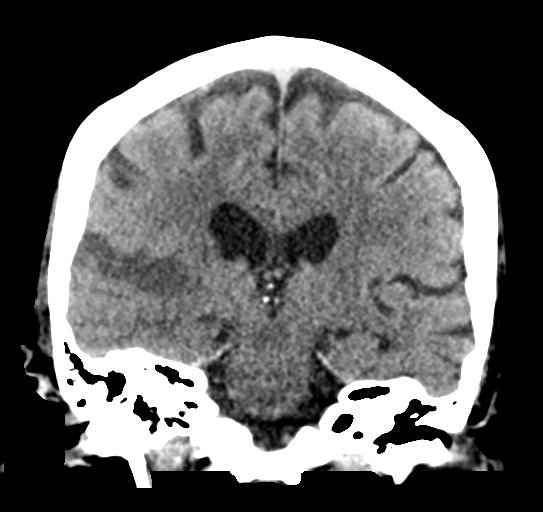
[im 37/67  brain]
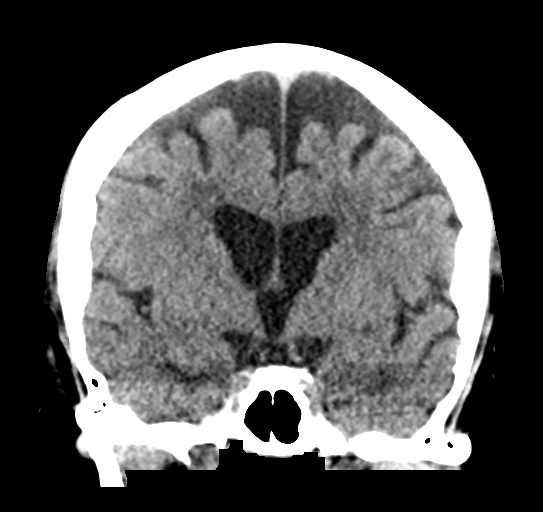

[Series 5: sagittal soft tissue · sagittal · 0.31mm/px · 3 of 51 slices shown]
[im 17/51  brain]
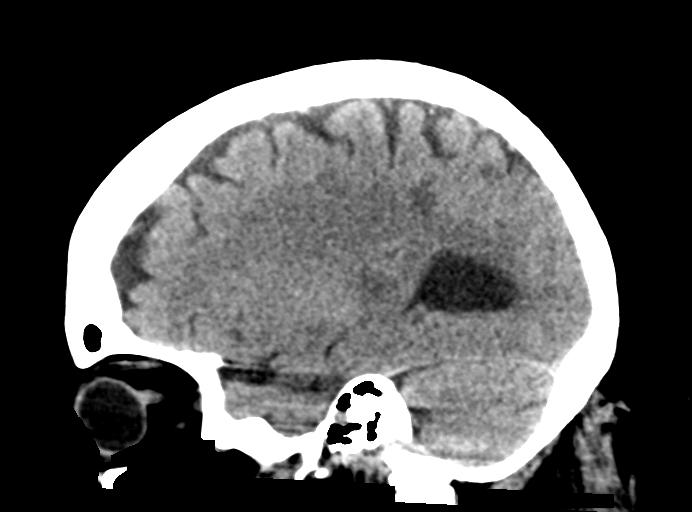
[im 26/51  brain]
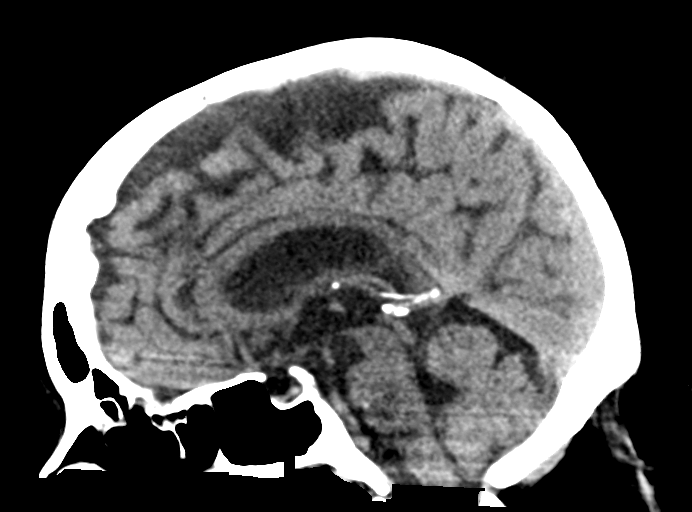
[im 34/51  brain]
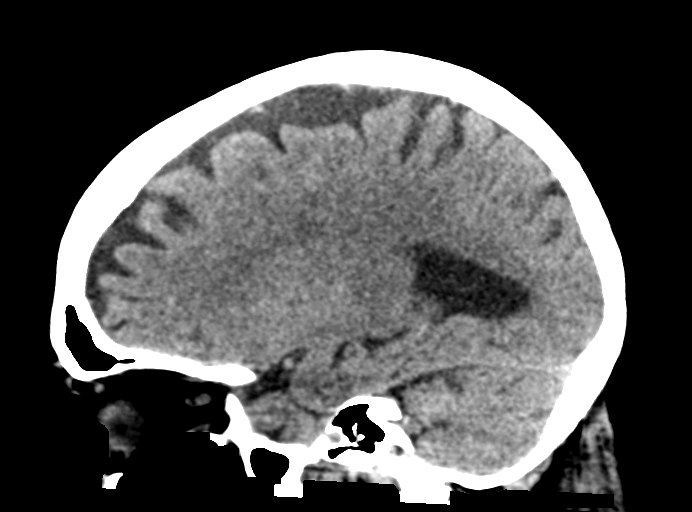

[14 of 47 positions shown; findings below may reference images not displayed]

FINDINGS: Brain: There is mild to moderate diffuse atrophy. There is decreased
attenuation in the posterosuperior right temporal lobe near the
temporal occipital junction with extension of apparent edema into
the posterior right lentiform nucleus. Slightly more superiorly,
there is a similar focus of decreased attenuation felt to represent
edema in the anterior right parietal lobe. This appearance is
somewhat unusual for infarct, and the possibility underlying mass
lesions with vasogenic edema must be of concern.

Elsewhere, there is slight periventricular small vessel disease in
the centra semiovale bilaterally. There is no hemorrhage, midline
shift, or extra-axial fluid collection.

Vascular: No hyperdense vessel. There is calcification in each
carotid siphon region.

Skull: The bony calvarium appears intact.

Sinuses/Orbits: There is mucosal thickening in several ethmoid air
cells bilaterally. Other visualized paranasal sinuses are clear.
There is leftward deviation the nasal septum. Orbits appear
symmetric bilaterally except for prior cataract removal on the
right.

Other: Mastoid air cells are clear. There is debris in each external
auditory canal.
IMPRESSION: 1. Decreased attenuation concerning for vasogenic edema in the
superior posterior right temporal lobe extending into the posterior
right lentiform nucleus. A second similar focus of concerning edema
is noted in the anterior right parietal lobe. While infarct could
present in this manner, the appearance raises concern for potential
mass lesions with edema. In this regard, would advise brain MRI pre
and post-contrast to further assess. If patient is a
contraindication to MRI, contrast enhanced brain CT would be an
alternative to further evaluate.

2. No hemorrhage. No midline shift or extra-axial fluid collection.
There is slight small vessel disease in the centra semiovale
bilaterally.

3.  There are foci of arterial vascular calcification.

4. Mucosal thickening in ethmoid air cells. Deviated nasal septum
toward the left.

5.  Probable cerumen in each external auditory canal.

## 2019-09-12 IMAGING — MR MR MRA HEAD W/O CM
12 of 14 series · 34 of 48 positions shown · IV contrast (multihance)
Comparison: Prior CT from earlier the same day.

CLINICAL DATA: Initial evaluation for sudden onset having, history
of multiple sclerosis.

EXAM:
MRI HEAD WITHOUT AND WITH CONTRAST
MRA HEAD WITHOUT CONTRAST
TECHNIQUE: Multiplanar, multiecho pulse sequences of the brain and surrounding
structures were obtained without and with intravenous contrast.
Angiographic images of the head were obtained using MRA technique
without contrast.
CONTRAST:  20mL MULTIHANCE GADOBENATE DIMEGLUMINE 529 MG/ML IV SOLN

[Series 3: DWI · axial · 3.0mm · 1.80mm/px · z∈[-105,+57]mm · 5 of 55 slices shown (1 of 2)]
[im 1/55]
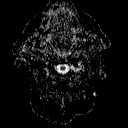
[im 14/55]
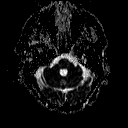
[im 28/55]
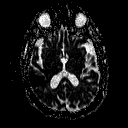
[im 41/55]
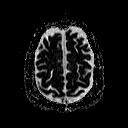
[im 55/55]
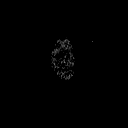

[Series 5: DWI · coronal · 3.0mm · 1.80mm/px · 3 of 49 slices shown (2 of 2)]
[im 1/49]
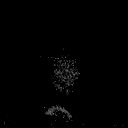
[im 25/49]
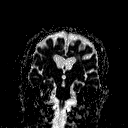
[im 49/49]
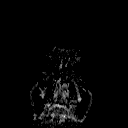

[Series 6: T1 · sagittal · 5.0mm · 0.45mm/px · 2 of 29 slices shown (1 of 2)]
[im 1/29]
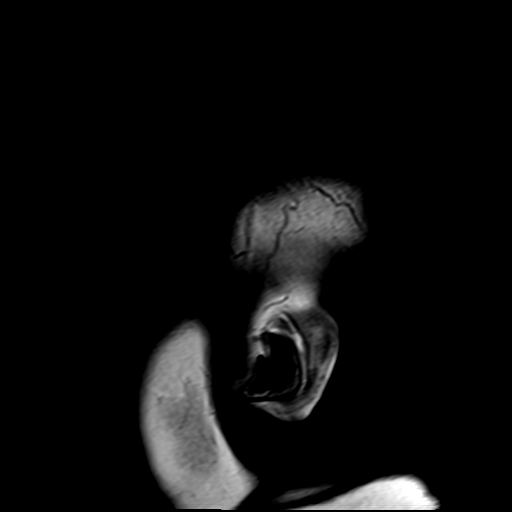
[im 29/29]
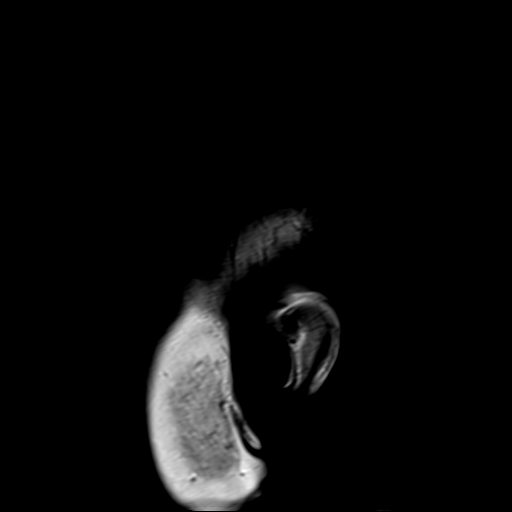

[Series 7: TOF · axial · non-contrast · 0.7mm · 0.37mm/px · z∈[-64,-25]mm · 4 of 150 slices shown]
[im 1/150]
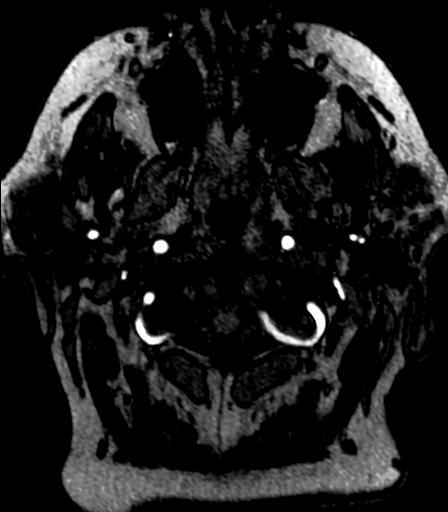
[im 30/150]
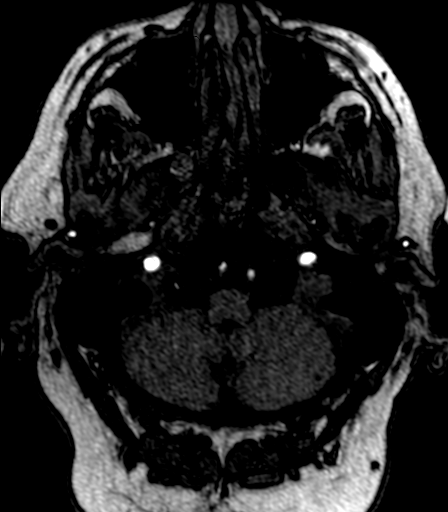
[im 45/150]
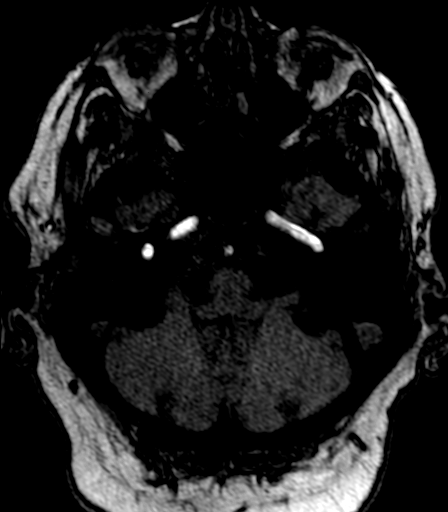
[im 60/150]
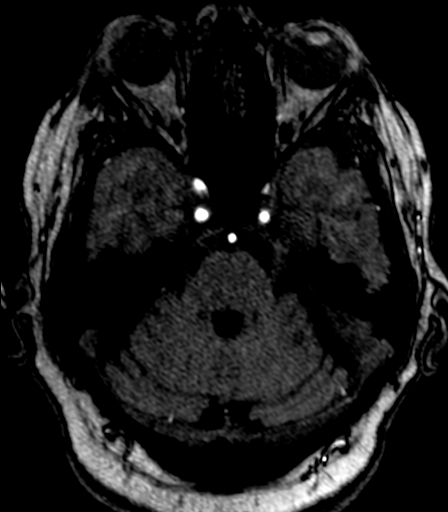

[Series 11: T2 · axial · 5.0mm · 0.90mm/px · z∈[-117,+52]mm · 2 of 27 slices shown (1 of 2)]
[im 1/27]
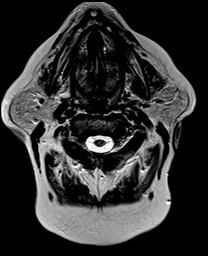
[im 27/27]
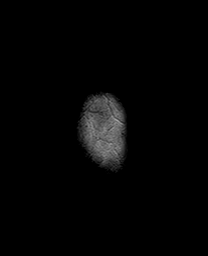

[Series 13: FLAIR · axial · 5.0mm · 0.45mm/px · z∈[-117,+52]mm · 2 of 27 slices shown]
[im 1/27]
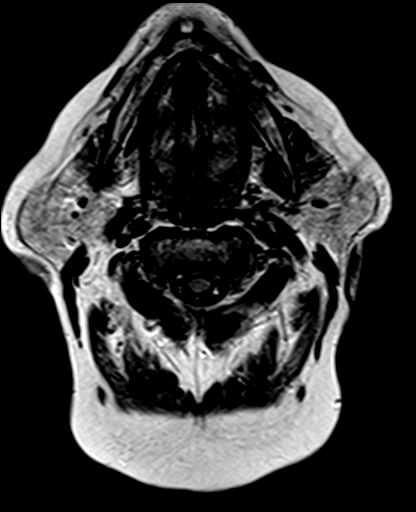
[im 27/27]
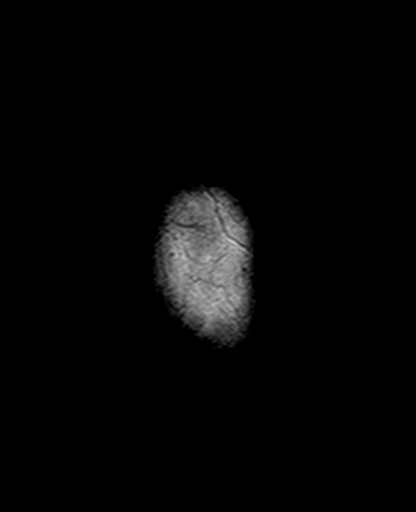

[Series 14: T2 · axial · 5.0mm · 0.45mm/px · z∈[-117,+52]mm · 2 of 27 slices shown (2 of 2)]
[im 1/27]
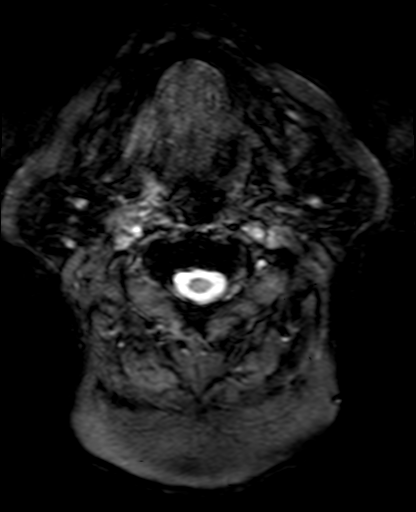
[im 27/27]
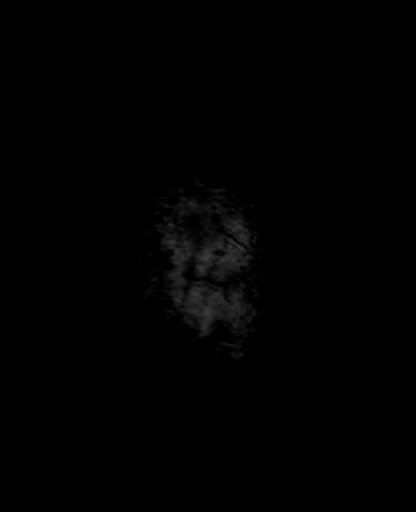

[Series 15: T1 · axial · 3.0mm · 1.00mm/px · z∈[-121,+56]mm · 4 of 60 slices shown (2 of 2)]
[im 1/60]
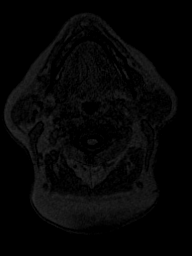
[im 20/60]
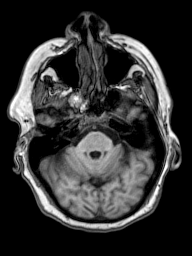
[im 40/60]
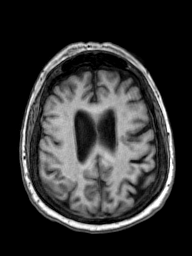
[im 60/60]
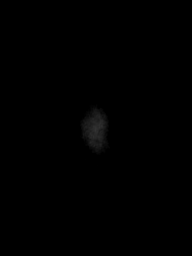

[Series 16: T2 post-contrast · coronal · 5.0mm · 0.86mm/px · 2 of 31 slices shown]
[im 1/31]
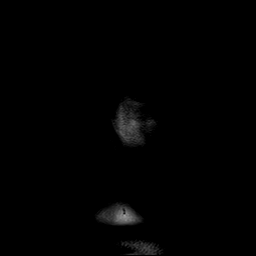
[im 31/31]
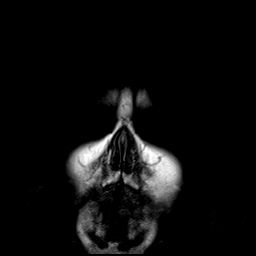

[Series 17: T1 post-contrast · axial · 3.0mm · 1.00mm/px · z∈[-121,+56]mm · 4 of 60 slices shown (1 of 3)]
[im 1/60]
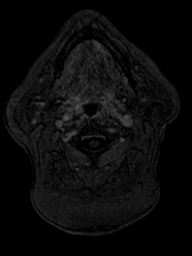
[im 20/60]
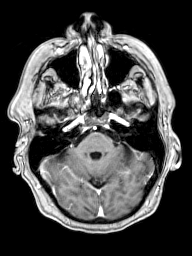
[im 40/60]
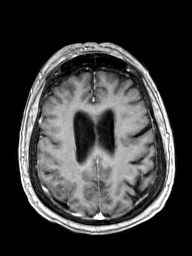
[im 60/60]
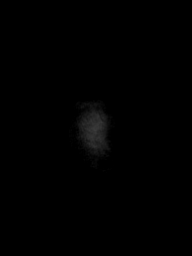

[Series 18: T1 post-contrast · coronal · 5.0mm · 0.43mm/px · 2 of 31 slices shown (2 of 3)]
[im 1/31]
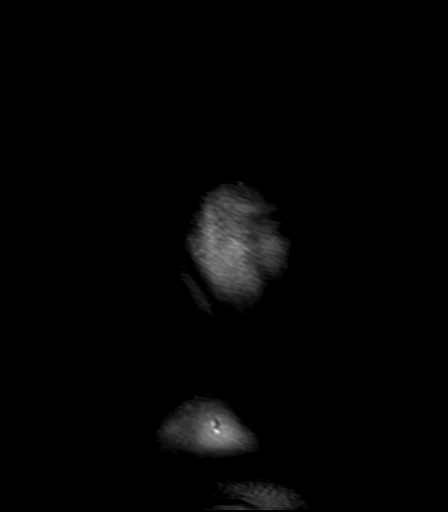
[im 31/31]
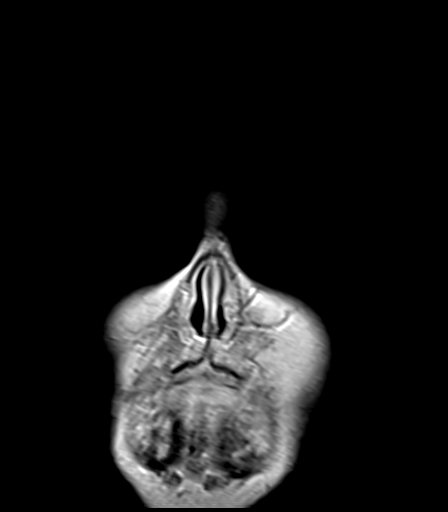

[Series 19: T1 post-contrast · sagittal · 5.0mm · 0.45mm/px · 2 of 29 slices shown (3 of 3)]
[im 1/29]
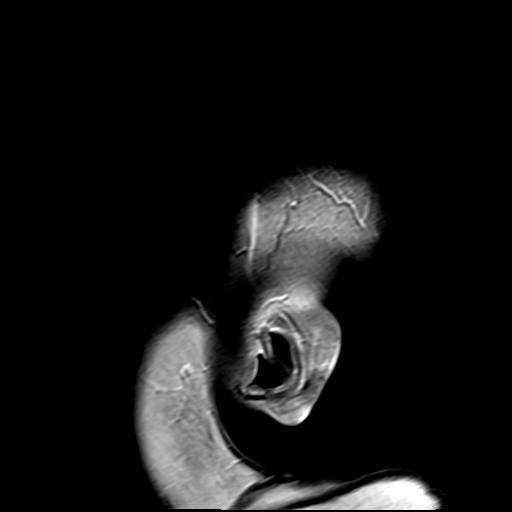
[im 29/29]
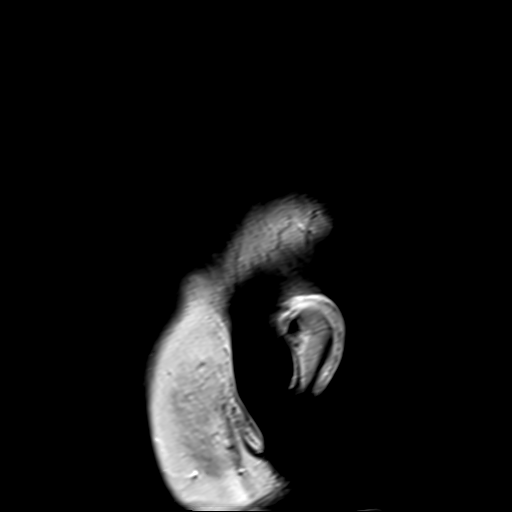

[34 of 48 positions shown; findings below may reference images not displayed]

FINDINGS: MRI HEAD FINDINGS

Brain: Moderate cerebral atrophy. Patchy T2/FLAIR hyperintensity
within the periventricular and deep white matter both cerebral
hemispheres, nonspecific, but could be related to provided history
of multiple sclerosis. Few small remote left cerebellar infarcts
noted. The

Patchy and confluent restricted diffusion seen involving the
posterior right insula, extending into the posterior right
frontoparietal region as well as the right occipital lobe,
consistent with acute right posterior MCA territory infarct.
Involvement of the cortical gray matter as well as the underlying
subcortical, deep, and periventricular white matter. Overall infarct
volume is moderate in size. No associated hemorrhage or significant
mass effect.

Additional patchy small volume ischemic infarcts measuring
approximately 15 mm present within the superior left cerebellar
hemisphere (series 100, image 15). No associated hemorrhage or mass
effect.

No other convincing evidence for acute or subacute ischemia.
Gray-white matter differentiation otherwise maintained. No evidence
for acute or chronic intracranial hemorrhage.

No mass lesion, midline shift or mass effect. Mild leptomeningeal
enhancement related to the posterior right MCA territory infarct. No
other abnormal enhancement. No findings to suggest active
demyelination. No hydrocephalus. No extra-axial fluid collection.
Major dural sinuses grossly patent.

Incidental note made of a partially empty sella. Midline structures
intact and normal.

Vascular: Major intracranial vascular flow voids are maintained.

Skull and upper cervical spine: Craniocervical junction normal.
Upper cervical spine within normal limits. Bone marrow signal
intensity normal. No scalp soft tissue abnormality.

Sinuses/Orbits: Globes and orbital soft tissues within normal
limits. Patient status post cataract extraction on the right.

Other: Paranasal sinuses are largely clear. No significant mastoid
effusion.

MRA HEAD FINDINGS

ANTERIOR CIRCULATION:

Distal cervical segments patent of the skullbase with antegrade
flow. Petrous, cavernous, and supraclinoid segments patent without
flow-limiting stenosis. A1 segments patent bilaterally. Left A1
segment slightly hypoplastic, which likely accounts for the slightly
diminutive left ICA is compared to the right. A1 segments and
anterior communicating artery patent and within normal limits. ACA
see irregular bilaterally with moderate multifocal stenoses, greater
on the right. ACAs are patent to their distal aspects.

M1 segments patent bilaterally without stenosis. Normal MCA
bifurcations. Moderate multifocal atheromatous regularity with
stenoses involving the right M2 segments and distal right MCA
branches. No proximal M2 stenosis or occlusion seen on the left. MCA
branches well perfused and fairly symmetric.

POSTERIOR CIRCULATION:

Vertebral arteries patent to the vertebrobasilar junction without
stenosis. Left PICA patent proximally. Right PICA not visualized.
Basilar artery patent to its distal aspect without stenosis. Patent
ICAs. Both of the posterior cerebral arteries supplied primarily via
the basilar. Small posterior communicating arteries noted
bilaterally. Right PCA patent to its distal aspect without
flow-limiting stenosis. Distal left P3 segment and distal small
branches are attenuated, possibly reflecting severe stenosis.

No aneurysm.
IMPRESSION: MRI HEAD IMPRESSION:

1. Patchy multifocal moderate sized posterior right MCA territory
infarcts, with additional small superior left cerebellar infarct. No
associated hemorrhage or mass effect. Possible central
thromboembolic etiology is suspected given the various vascular
distributions involved. The
2. Underlying mild cerebral white matter changes, nonspecific, but
could be consistent with provided history of multiple sclerosis. No
evidence for active demyelination.
3. Mild age-related cerebral atrophy. No other acute intracranial
abnormality.

MRA HEAD IMPRESSION:

1. Negative intracranial MRA for large or proximal arterial branch
occlusion. No correctable stenosis.
2. Moderate atheromatous irregularity involving the intracranial
circulation, most notably within the anterior cerebral arteries,
distal right MCA branches, and distal left PCA.

## 2019-10-14 ENCOUNTER — Other Ambulatory Visit: Payer: Self-pay | Admitting: Internal Medicine

## 2019-10-14 DIAGNOSIS — E1169 Type 2 diabetes mellitus with other specified complication: Secondary | ICD-10-CM

## 2019-11-12 IMAGING — MR MR THORACIC SPINE W/O CM
10 series · 44 of 48 positions shown · IV contrast (agent unspecified)
Comparison: Brain MRI 05/10/2017.

CLINICAL DATA: 66-year-old female with chronic multiple sclerosis.

A study without and with contrast was planned, but the examination
had to be discontinued prior to completion. No contrast was
administered.
EXAM:
MRI CERVICAL AND THORACIC SPINE WITHOUT CONTRAST
TECHNIQUE: Multiplanar and multiecho pulse sequences of the cervical and
thoracic spine, were obtained without intravenous contrast.

[Series 3: T2 · sagittal · 3.0mm · 0.70mm/px · 4 of 15 slices shown (1 of 4)]
[im 1/15]
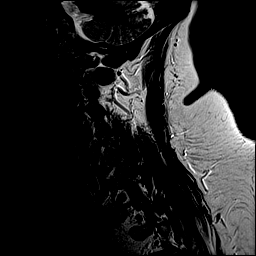
[im 5/15]
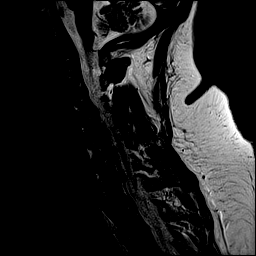
[im 10/15]
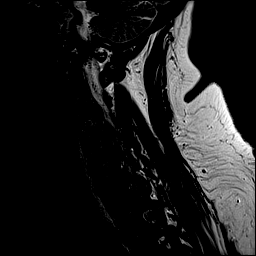
[im 15/15]
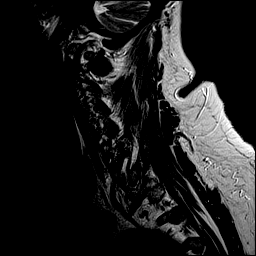

[Series 4: T1 · sagittal · 3.0mm · 0.70mm/px · 4 of 15 slices shown (1 of 3)]
[im 1/15]
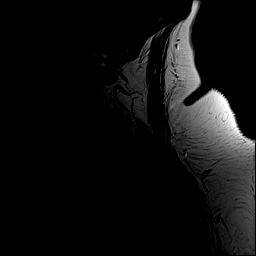
[im 5/15]
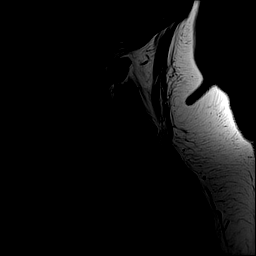
[im 10/15]
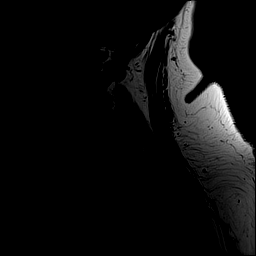
[im 15/15]
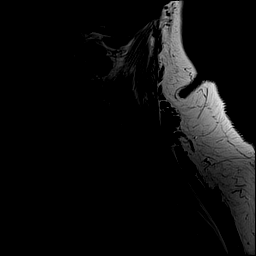

[Series 5: STIR · sagittal · 3.0mm · 0.70mm/px · 4 of 15 slices shown (1 of 2)]
[im 1/15]
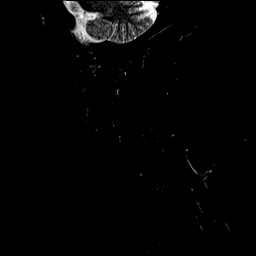
[im 5/15]
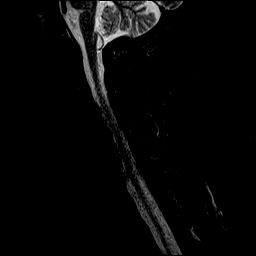
[im 10/15]
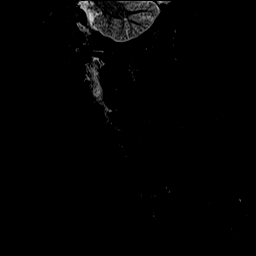
[im 15/15]
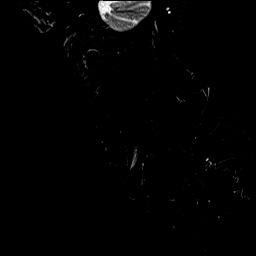

[Series 6: T2 · axial · 3.0mm · 0.70mm/px · z∈[-107,-14]mm · 6 of 27 slices shown (2 of 4)]
[im 1/27]
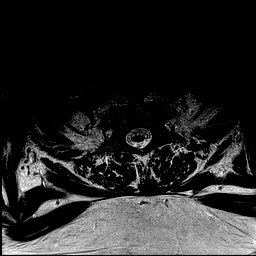
[im 6/27]
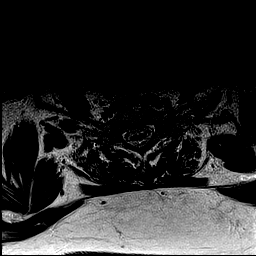
[im 11/27]
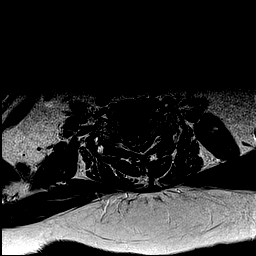
[im 16/27]
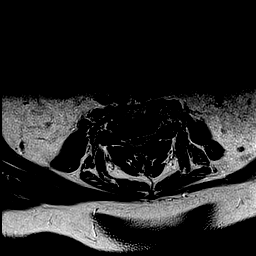
[im 21/27]
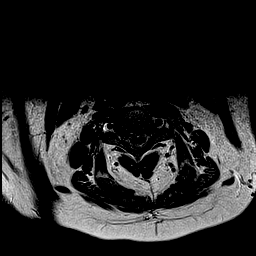
[im 27/27]
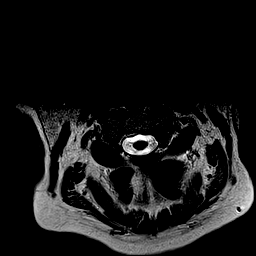

[Series 7: mpgr ax · axial · 3.0mm · 0.35mm/px · z∈[-107,-89]mm · 2 of 27 slices shown]
[im 1/27]
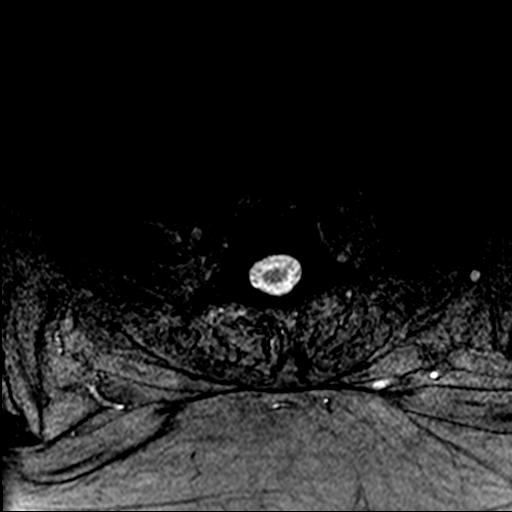
[im 6/27]
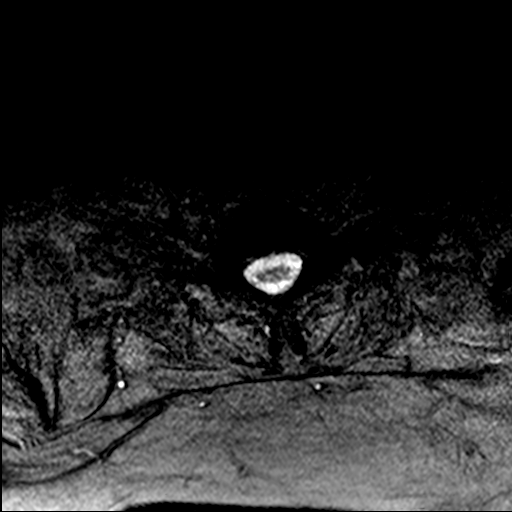

[Series 8: T1 · axial · 3.0mm · 0.56mm/px · z∈[-107,-14]mm · 6 of 27 slices shown (2 of 3)]
[im 1/27]
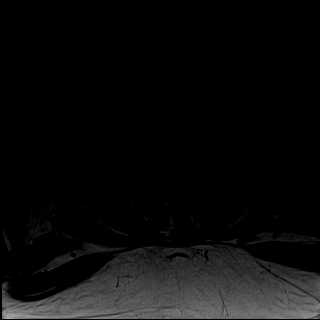
[im 6/27]
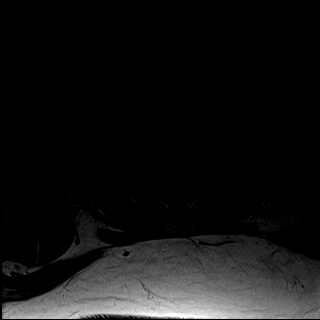
[im 11/27]
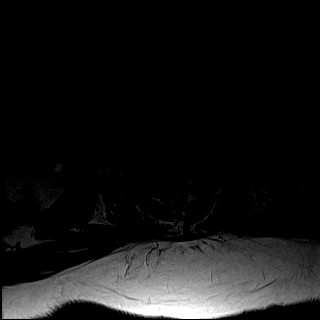
[im 16/27]
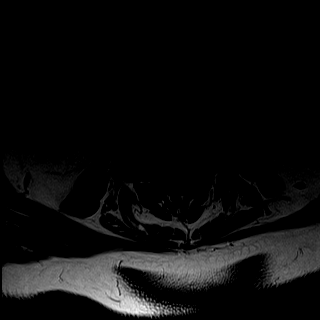
[im 21/27]
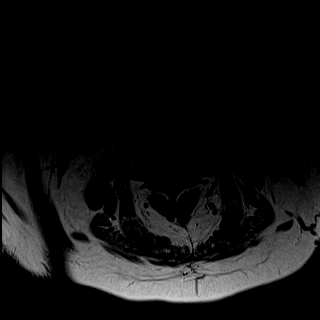
[im 27/27]
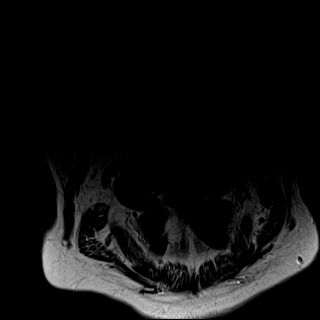

[Series 12: STIR · sagittal · 4.0mm · 1.33mm/px · 3 of 15 slices shown (2 of 2)]
[im 1/15]
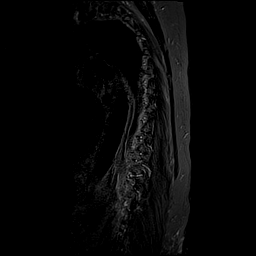
[im 8/15]
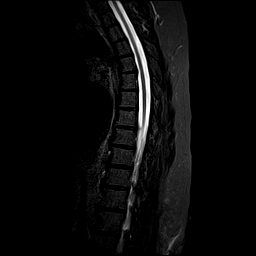
[im 15/15]
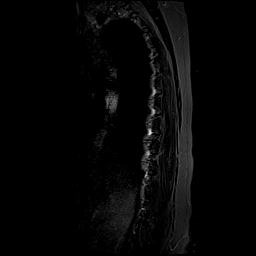

[Series 13: T1 · sagittal · 4.0mm · 1.33mm/px · 3 of 15 slices shown (3 of 3)]
[im 1/15]
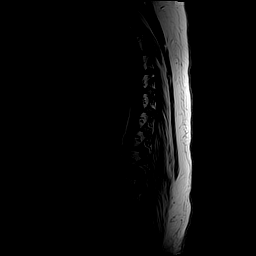
[im 8/15]
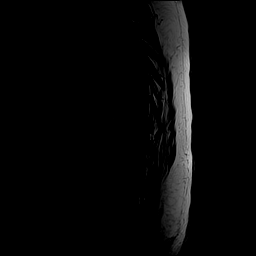
[im 15/15]
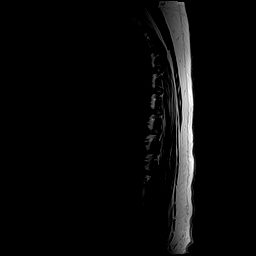

[Series 14: T2 · sagittal · 4.0mm · 1.33mm/px · 3 of 15 slices shown (3 of 4)]
[im 1/15]
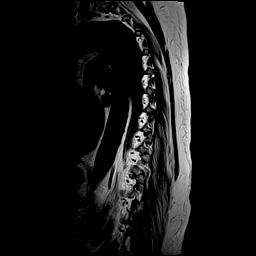
[im 8/15]
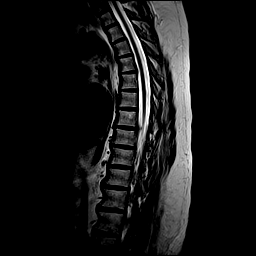
[im 15/15]
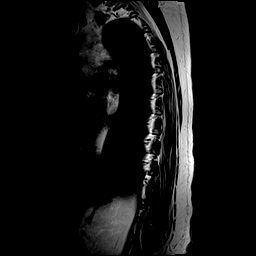

[Series 15: T2 · axial · 5.0mm · 0.86mm/px · z∈[-359,-102]mm · 9 of 40 slices shown (4 of 4)]
[im 1/40]
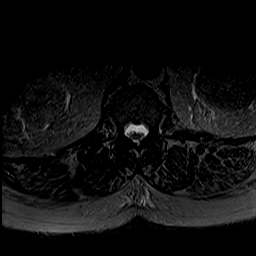
[im 5/40]
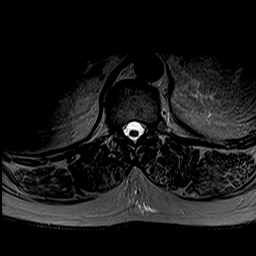
[im 10/40]
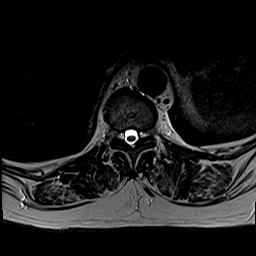
[im 15/40]
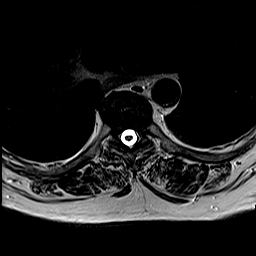
[im 20/40]
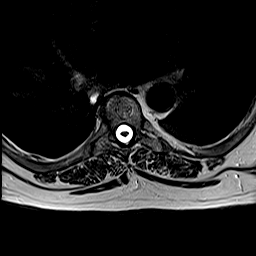
[im 25/40]
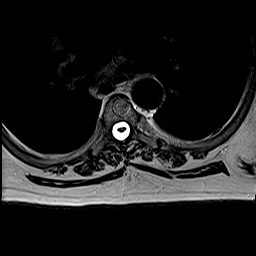
[im 30/40]
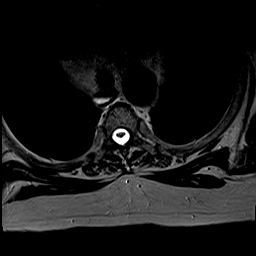
[im 35/40]
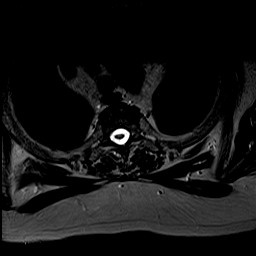
[im 40/40]
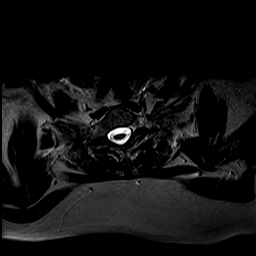

[44 of 48 positions shown; findings below may reference images not displayed]

FINDINGS: MRI CERVICAL SPINE FINDINGS

Alignment: Mild reversal of cervical lordosis. associated subtle
multilevel spondylolisthesis, including mild anterolisthesis at
C3-C4.

Vertebrae: Normal bone marrow signal aside from chronic degenerative
endplate marrow signal changes most pronounced at C5-C6 and C6-C7.
No marrow edema or evidence of acute osseous abnormality.

Cord: There is T2 hyperintensity in the right dorsal hemicord at
C2-C3 (series 6, image 4). No definite additional cervical spinal
cord lesion. Overall cervical spinal cord volume appears maintained.
See degenerative cervical spinal stenosis detailed below.

Posterior Fossa, vertebral arteries, paraspinal tissues: Negative
visible brainstem and cerebellum.

Disc levels:

C2-C3: Mild foraminal disc bulging and endplate spurring. No
stenosis.

C3-C4: Circumferential disc bulge eccentric to the right.
Broad-based right greater than left foraminal disc and endplate
spurring. No spinal stenosis. Moderate left and moderate to severe
right C4 foraminal stenosis.

C4-C5: Disc space loss. Circumferential disc bulge and endplate
spurring with broad-based foraminal components greater on the right.
Mild spinal stenosis. Possible mild spinal cord mass effect.
Moderate to severe left and severe right C5 foraminal stenosis.

C5-C6: Disc space loss with bulky circumferential disc bulge and
endplate spurring. Broad-based right paracentral posterior component
(series 7, image 17). Spinal stenosis with mild spinal cord mass
effect. Severe left and moderate to severe right C6 foraminal
stenosis.

C6-C7: Disc space loss with circumferential disc osteophyte complex.
Broad-based posterior and foraminal components. No significant
spinal stenosis. Moderate to severe bilateral C7 foraminal stenosis.

C7-T1:  Negative.

MRI THORACIC SPINE FINDINGS

Thoracic spine segmentation:  Appears normal.

Alignment:  Normal thoracic vertebral height and alignment.

Vertebrae: Visualized bone marrow signal is within normal limits
(benign vertebral body hemangioma in T4). No marrow edema or
evidence of acute osseous abnormality.

Cord: Suggestion of mild volume loss throughout the thoracic spinal
cord. No definite focal thoracic spinal cord signal abnormality. The
conus medullaris is mostly visible at T12-L1 and appears normal.

Paraspinal and other soft tissues: Mediastinal lipomatosis.
Otherwise negative visualized thoracic and upper abdominal viscera.
Mild posterior subcutaneous edema in the visible upper lumbar spine.

Disc levels:

T1-T2: Negative.

T2-T3: Mild facet and endplate spurring. Borderline to mild T2
foraminal stenosis greater on the right.

T3-T4: Mild facet and endplate spurring. Mild right T3 foraminal
stenosis.

T4-T5: Broad-based central disc protrusion (series 15, image 14)
without spinal or foraminal stenosis.

T5-T6: Small right subarticular disc protrusion (series 15, image
17). No stenosis.

T6-T7: Small left paracentral to subarticular disc protrusion
(series 15, image 20). No associated stenosis. There is borderline
to mild left T6 foraminal stenosis related to endplate spurring.

T7-T8: Broad-based right subarticular disc protrusion (series 15,
image 23). This contributes to borderline to mild right T7 foraminal
stenosis otherwise related to facet and endplate spurring.

T8-T9: Mild circumferential disc bulge and endplate spurring. Mild
facet hypertrophy. Mild bilateral T8 foraminal stenosis.

T9-T10: Mild circumferential disc bulge and endplate spurring. Mild
facet hypertrophy. Mild bilateral T9 foraminal stenosis greater on
the right.

T10-T11: Circumferential disc bulge and endplate spurring. Moderate
facet and mild to moderate ligament flavum hypertrophy. No spinal
stenosis. Moderate left and mild right T10 neural foraminal
stenosis.

T11-T12: Mild circumferential disc bulge.  No stenosis.

T12-L1: Small central disc protrusion (series 15, image 39). No
stenosis.
IMPRESSION: 1. The examination had to be discontinued prior to contrast
administration.
2. Suspected chronic demyelinating lesion in the right hemicord at
C2-C3. Suggestion of volume loss throughout the thoracic spinal
cord, but no other discrete spinal cord lesion or spinal cord signal
abnormality identified.
3. Cervical and thoracic spine degeneration with no acute osseous
abnormality.
4. Degenerative cervical spinal stenosis at C4-C5 and C5-C6 with
mild spinal cord mass effect. Moderate or severe cervical neural
foraminal stenosis at the bilateral C4, C5, C6, and C7 nerve levels.
5. No thoracic spinal stenosis. Moderate thoracic neural foraminal
stenosis at the left T10 nerve level. Mild thoracic foraminal
stenosis elsewhere.

## 2019-11-21 ENCOUNTER — Other Ambulatory Visit: Payer: Self-pay | Admitting: Internal Medicine

## 2019-11-21 DIAGNOSIS — E118 Type 2 diabetes mellitus with unspecified complications: Secondary | ICD-10-CM

## 2020-06-22 ENCOUNTER — Ambulatory Visit: Payer: Medicare HMO | Admitting: Neurology

## 2023-12-27 ENCOUNTER — Inpatient Hospital Stay (HOSPITAL_COMMUNITY)

## 2023-12-27 ENCOUNTER — Inpatient Hospital Stay (HOSPITAL_COMMUNITY)
Admission: EM | Admit: 2023-12-27 | Discharge: 2024-01-07 | DRG: 871 | Disposition: E | Attending: Neurology | Admitting: Neurology

## 2023-12-27 ENCOUNTER — Other Ambulatory Visit: Payer: Self-pay

## 2023-12-27 ENCOUNTER — Encounter (HOSPITAL_COMMUNITY): Payer: Self-pay

## 2023-12-27 ENCOUNTER — Emergency Department (HOSPITAL_COMMUNITY)

## 2023-12-27 DIAGNOSIS — R1310 Dysphagia, unspecified: Secondary | ICD-10-CM | POA: Diagnosis present

## 2023-12-27 DIAGNOSIS — R7881 Bacteremia: Secondary | ICD-10-CM | POA: Diagnosis not present

## 2023-12-27 DIAGNOSIS — I6340 Cerebral infarction due to embolism of unspecified cerebral artery: Secondary | ICD-10-CM | POA: Diagnosis present

## 2023-12-27 DIAGNOSIS — G35 Multiple sclerosis: Secondary | ICD-10-CM | POA: Diagnosis present

## 2023-12-27 DIAGNOSIS — Z7901 Long term (current) use of anticoagulants: Secondary | ICD-10-CM | POA: Diagnosis not present

## 2023-12-27 DIAGNOSIS — I619 Nontraumatic intracerebral hemorrhage, unspecified: Secondary | ICD-10-CM | POA: Diagnosis present

## 2023-12-27 DIAGNOSIS — Z66 Do not resuscitate: Secondary | ICD-10-CM | POA: Diagnosis present

## 2023-12-27 DIAGNOSIS — I6389 Other cerebral infarction: Secondary | ICD-10-CM | POA: Diagnosis not present

## 2023-12-27 DIAGNOSIS — J690 Pneumonitis due to inhalation of food and vomit: Secondary | ICD-10-CM | POA: Diagnosis present

## 2023-12-27 DIAGNOSIS — R569 Unspecified convulsions: Secondary | ICD-10-CM | POA: Diagnosis not present

## 2023-12-27 DIAGNOSIS — J9600 Acute respiratory failure, unspecified whether with hypoxia or hypercapnia: Secondary | ICD-10-CM | POA: Diagnosis not present

## 2023-12-27 DIAGNOSIS — B9561 Methicillin susceptible Staphylococcus aureus infection as the cause of diseases classified elsewhere: Secondary | ICD-10-CM | POA: Diagnosis not present

## 2023-12-27 DIAGNOSIS — E119 Type 2 diabetes mellitus without complications: Secondary | ICD-10-CM | POA: Diagnosis present

## 2023-12-27 DIAGNOSIS — G928 Other toxic encephalopathy: Secondary | ICD-10-CM | POA: Diagnosis present

## 2023-12-27 DIAGNOSIS — R7989 Other specified abnormal findings of blood chemistry: Secondary | ICD-10-CM | POA: Diagnosis present

## 2023-12-27 DIAGNOSIS — Z6836 Body mass index (BMI) 36.0-36.9, adult: Secondary | ICD-10-CM | POA: Diagnosis not present

## 2023-12-27 DIAGNOSIS — Z1152 Encounter for screening for COVID-19: Secondary | ICD-10-CM | POA: Diagnosis not present

## 2023-12-27 DIAGNOSIS — I611 Nontraumatic intracerebral hemorrhage in hemisphere, cortical: Secondary | ICD-10-CM | POA: Diagnosis present

## 2023-12-27 DIAGNOSIS — R29721 NIHSS score 21: Secondary | ICD-10-CM | POA: Diagnosis not present

## 2023-12-27 DIAGNOSIS — R29737 NIHSS score 37: Secondary | ICD-10-CM | POA: Diagnosis not present

## 2023-12-27 DIAGNOSIS — A4101 Sepsis due to Methicillin susceptible Staphylococcus aureus: Principal | ICD-10-CM | POA: Diagnosis present

## 2023-12-27 DIAGNOSIS — E876 Hypokalemia: Secondary | ICD-10-CM | POA: Diagnosis not present

## 2023-12-27 DIAGNOSIS — A419 Sepsis, unspecified organism: Principal | ICD-10-CM

## 2023-12-27 DIAGNOSIS — Z515 Encounter for palliative care: Secondary | ICD-10-CM | POA: Diagnosis not present

## 2023-12-27 DIAGNOSIS — E871 Hypo-osmolality and hyponatremia: Secondary | ICD-10-CM | POA: Diagnosis present

## 2023-12-27 DIAGNOSIS — E872 Acidosis, unspecified: Secondary | ICD-10-CM | POA: Diagnosis present

## 2023-12-27 DIAGNOSIS — I680 Cerebral amyloid angiopathy: Secondary | ICD-10-CM | POA: Diagnosis present

## 2023-12-27 DIAGNOSIS — I76 Septic arterial embolism: Secondary | ICD-10-CM | POA: Diagnosis present

## 2023-12-27 DIAGNOSIS — I38 Endocarditis, valve unspecified: Secondary | ICD-10-CM | POA: Diagnosis present

## 2023-12-27 DIAGNOSIS — Z8673 Personal history of transient ischemic attack (TIA), and cerebral infarction without residual deficits: Secondary | ICD-10-CM

## 2023-12-27 DIAGNOSIS — J9601 Acute respiratory failure with hypoxia: Secondary | ICD-10-CM | POA: Diagnosis present

## 2023-12-27 DIAGNOSIS — Z794 Long term (current) use of insulin: Secondary | ICD-10-CM

## 2023-12-27 DIAGNOSIS — I4891 Unspecified atrial fibrillation: Secondary | ICD-10-CM | POA: Diagnosis not present

## 2023-12-27 DIAGNOSIS — R6521 Severe sepsis with septic shock: Secondary | ICD-10-CM | POA: Diagnosis present

## 2023-12-27 DIAGNOSIS — J9811 Atelectasis: Secondary | ICD-10-CM | POA: Diagnosis present

## 2023-12-27 DIAGNOSIS — J9690 Respiratory failure, unspecified, unspecified whether with hypoxia or hypercapnia: Secondary | ICD-10-CM | POA: Diagnosis not present

## 2023-12-27 DIAGNOSIS — E854 Organ-limited amyloidosis: Secondary | ICD-10-CM | POA: Diagnosis present

## 2023-12-27 DIAGNOSIS — I429 Cardiomyopathy, unspecified: Secondary | ICD-10-CM | POA: Diagnosis present

## 2023-12-27 DIAGNOSIS — G8191 Hemiplegia, unspecified affecting right dominant side: Secondary | ICD-10-CM | POA: Diagnosis present

## 2023-12-27 DIAGNOSIS — N390 Urinary tract infection, site not specified: Secondary | ICD-10-CM | POA: Diagnosis not present

## 2023-12-27 DIAGNOSIS — R4701 Aphasia: Secondary | ICD-10-CM | POA: Diagnosis present

## 2023-12-27 DIAGNOSIS — Z79899 Other long term (current) drug therapy: Secondary | ICD-10-CM

## 2023-12-27 DIAGNOSIS — N179 Acute kidney failure, unspecified: Secondary | ICD-10-CM | POA: Diagnosis present

## 2023-12-27 DIAGNOSIS — I10 Essential (primary) hypertension: Secondary | ICD-10-CM | POA: Diagnosis present

## 2023-12-27 DIAGNOSIS — G9349 Other encephalopathy: Secondary | ICD-10-CM | POA: Diagnosis present

## 2023-12-27 DIAGNOSIS — G039 Meningitis, unspecified: Secondary | ICD-10-CM | POA: Diagnosis present

## 2023-12-27 DIAGNOSIS — R29726 NIHSS score 26: Secondary | ICD-10-CM | POA: Diagnosis present

## 2023-12-27 DIAGNOSIS — E669 Obesity, unspecified: Secondary | ICD-10-CM | POA: Diagnosis present

## 2023-12-27 DIAGNOSIS — R579 Shock, unspecified: Secondary | ICD-10-CM | POA: Diagnosis not present

## 2023-12-27 DIAGNOSIS — Z7401 Bed confinement status: Secondary | ICD-10-CM

## 2023-12-27 DIAGNOSIS — E8721 Acute metabolic acidosis: Secondary | ICD-10-CM | POA: Diagnosis not present

## 2023-12-27 DIAGNOSIS — S0632AA Contusion and laceration of left cerebrum with loss of consciousness status unknown, initial encounter: Secondary | ICD-10-CM

## 2023-12-27 DIAGNOSIS — A4901 Methicillin susceptible Staphylococcus aureus infection, unspecified site: Secondary | ICD-10-CM | POA: Diagnosis not present

## 2023-12-27 DIAGNOSIS — E785 Hyperlipidemia, unspecified: Secondary | ICD-10-CM | POA: Diagnosis present

## 2023-12-27 DIAGNOSIS — I618 Other nontraumatic intracerebral hemorrhage: Secondary | ICD-10-CM | POA: Diagnosis present

## 2023-12-27 DIAGNOSIS — Z7985 Long-term (current) use of injectable non-insulin antidiabetic drugs: Secondary | ICD-10-CM

## 2023-12-27 LAB — COMPREHENSIVE METABOLIC PANEL WITH GFR
ALT: 27 U/L (ref 0–44)
AST: 66 U/L — ABNORMAL HIGH (ref 15–41)
Albumin: 2 g/dL — ABNORMAL LOW (ref 3.5–5.0)
Alkaline Phosphatase: 53 U/L (ref 38–126)
Anion gap: 17 — ABNORMAL HIGH (ref 5–15)
BUN: 34 mg/dL — ABNORMAL HIGH (ref 8–23)
CO2: 16 mmol/L — ABNORMAL LOW (ref 22–32)
Calcium: 8.4 mg/dL — ABNORMAL LOW (ref 8.9–10.3)
Chloride: 94 mmol/L — ABNORMAL LOW (ref 98–111)
Creatinine, Ser: 1.24 mg/dL — ABNORMAL HIGH (ref 0.44–1.00)
GFR, Estimated: 46 mL/min — ABNORMAL LOW (ref >60)
Glucose, Bld: 199 mg/dL — ABNORMAL HIGH (ref 70–99)
Potassium: 3.7 mmol/L (ref 3.5–5.1)
Sodium: 127 mmol/L — ABNORMAL LOW (ref 135–145)
Total Bilirubin: 2.8 mg/dL — ABNORMAL HIGH (ref 0.0–1.2)
Total Protein: 6 g/dL — ABNORMAL LOW (ref 6.5–8.1)

## 2023-12-27 LAB — URINALYSIS, W/ REFLEX TO CULTURE (INFECTION SUSPECTED)
Bilirubin Urine: NEGATIVE
Glucose, UA: NEGATIVE mg/dL
Ketones, ur: NEGATIVE mg/dL
Nitrite: NEGATIVE
Protein, ur: 100 mg/dL — AB
RBC / HPF: >50 RBC/hpf (ref 0–5)
Specific Gravity, Urine: 1.031 — ABNORMAL HIGH (ref 1.005–1.030)
WBC, UA: >50 WBC/hpf (ref 0–5)
pH: 5 (ref 5.0–8.0)

## 2023-12-27 LAB — I-STAT VENOUS BLOOD GAS, ED
Acid-base deficit: 1 mmol/L (ref 0.0–2.0)
Bicarbonate: 19.7 mmol/L — ABNORMAL LOW (ref 20.0–28.0)
Calcium, Ion: 1.06 mmol/L — ABNORMAL LOW (ref 1.15–1.40)
HCT: 35 % — ABNORMAL LOW (ref 36.0–46.0)
Hemoglobin: 11.9 g/dL — ABNORMAL LOW (ref 12.0–15.0)
O2 Saturation: 99 %
Potassium: 3.7 mmol/L (ref 3.5–5.1)
Sodium: 126 mmol/L — ABNORMAL LOW (ref 135–145)
TCO2: 20 mmol/L — ABNORMAL LOW (ref 22–32)
pCO2, Ven: 22.7 mmHg — ABNORMAL LOW (ref 44–60)
pH, Ven: 7.546 — ABNORMAL HIGH (ref 7.25–7.43)
pO2, Ven: 106 mmHg — ABNORMAL HIGH (ref 32–45)

## 2023-12-27 LAB — RESP PANEL BY RT-PCR (RSV, FLU A&B, COVID)  RVPGX2
Influenza A by PCR: NEGATIVE
Influenza B by PCR: NEGATIVE
Resp Syncytial Virus by PCR: NEGATIVE
SARS Coronavirus 2 by RT PCR: NEGATIVE

## 2023-12-27 LAB — I-STAT CHEM 8, ED
BUN: 35 mg/dL — ABNORMAL HIGH (ref 8–23)
Calcium, Ion: 1.06 mmol/L — ABNORMAL LOW (ref 1.15–1.40)
Chloride: 93 mmol/L — ABNORMAL LOW (ref 98–111)
Creatinine, Ser: 1.2 mg/dL — ABNORMAL HIGH (ref 0.44–1.00)
Glucose, Bld: 201 mg/dL — ABNORMAL HIGH (ref 70–99)
HCT: 36 % (ref 36.0–46.0)
Hemoglobin: 12.2 g/dL (ref 12.0–15.0)
Potassium: 3.7 mmol/L (ref 3.5–5.1)
Sodium: 126 mmol/L — ABNORMAL LOW (ref 135–145)
TCO2: 18 mmol/L — ABNORMAL LOW (ref 22–32)

## 2023-12-27 LAB — DIFFERENTIAL
Abs Immature Granulocytes: 0.11 10*3/uL — ABNORMAL HIGH (ref 0.00–0.07)
Basophils Absolute: 0 10*3/uL (ref 0.0–0.1)
Basophils Relative: 0 %
Eosinophils Absolute: 0 10*3/uL (ref 0.0–0.5)
Eosinophils Relative: 0 %
Immature Granulocytes: 1 %
Lymphocytes Relative: 1 %
Lymphs Abs: 0.1 10*3/uL — ABNORMAL LOW (ref 0.7–4.0)
Monocytes Absolute: 0.7 10*3/uL (ref 0.1–1.0)
Monocytes Relative: 6 %
Neutro Abs: 11.3 10*3/uL — ABNORMAL HIGH (ref 1.7–7.7)
Neutrophils Relative %: 92 %

## 2023-12-27 LAB — CBC
HCT: 35 % — ABNORMAL LOW (ref 36.0–46.0)
Hemoglobin: 11.9 g/dL — ABNORMAL LOW (ref 12.0–15.0)
MCH: 28.5 pg (ref 26.0–34.0)
MCHC: 34 g/dL (ref 30.0–36.0)
MCV: 83.7 fL (ref 80.0–100.0)
Platelets: 117 10*3/uL — ABNORMAL LOW (ref 150–400)
RBC: 4.18 MIL/uL (ref 3.87–5.11)
RDW: 15.1 % (ref 11.5–15.5)
WBC: 12.4 10*3/uL — ABNORMAL HIGH (ref 4.0–10.5)
nRBC: 0 % (ref 0.0–0.2)

## 2023-12-27 LAB — PROTIME-INR
INR: 4 — ABNORMAL HIGH (ref 0.8–1.2)
INR: 1.6 — ABNORMAL HIGH (ref 0.8–1.2)
Prothrombin Time: 38.9 s — ABNORMAL HIGH (ref 11.4–15.2)
Prothrombin Time: 19.7 s — ABNORMAL HIGH (ref 11.4–15.2)

## 2023-12-27 LAB — POCT I-STAT 7, (LYTES, BLD GAS, ICA,H+H)
Acid-base deficit: 7 mmol/L — ABNORMAL HIGH (ref 0.0–2.0)
Bicarbonate: 17.4 mmol/L — ABNORMAL LOW (ref 20.0–28.0)
Calcium, Ion: 1.17 mmol/L (ref 1.15–1.40)
HCT: 31 % — ABNORMAL LOW (ref 36.0–46.0)
Hemoglobin: 10.5 g/dL — ABNORMAL LOW (ref 12.0–15.0)
O2 Saturation: 100 %
Potassium: 3.6 mmol/L (ref 3.5–5.1)
Sodium: 127 mmol/L — ABNORMAL LOW (ref 135–145)
TCO2: 18 mmol/L — ABNORMAL LOW (ref 22–32)
pCO2 arterial: 32.1 mmHg (ref 32–48)
pH, Arterial: 7.341 — ABNORMAL LOW (ref 7.35–7.45)
pO2, Arterial: 476 mmHg — ABNORMAL HIGH (ref 83–108)

## 2023-12-27 LAB — RAPID URINE DRUG SCREEN, HOSP PERFORMED
Amphetamines: NOT DETECTED
Barbiturates: NOT DETECTED
Benzodiazepines: NOT DETECTED
Cocaine: NOT DETECTED
Opiates: NOT DETECTED
Tetrahydrocannabinol: NOT DETECTED

## 2023-12-27 LAB — TROPONIN I (HIGH SENSITIVITY)
Troponin I (High Sensitivity): 431 ng/L (ref <18)
Troponin I (High Sensitivity): 592 ng/L (ref <18)

## 2023-12-27 LAB — I-STAT CG4 LACTIC ACID, ED
Lactic Acid, Venous: 5.2 mmol/L (ref 0.5–1.9)
Lactic Acid, Venous: 5.4 mmol/L (ref 0.5–1.9)
Lactic Acid, Venous: 7.6 mmol/L (ref 0.5–1.9)

## 2023-12-27 LAB — ETHANOL: Alcohol, Ethyl (B): <15 mg/dL (ref <15)

## 2023-12-27 LAB — LACTIC ACID, PLASMA: Lactic Acid, Venous: 3.1 mmol/L (ref 0.5–1.9)

## 2023-12-27 LAB — CBG MONITORING, ED: Glucose-Capillary: 199 mg/dL — ABNORMAL HIGH (ref 70–99)

## 2023-12-27 LAB — MRSA NEXT GEN BY PCR, NASAL: MRSA by PCR Next Gen: DETECTED — AB

## 2023-12-27 LAB — APTT: aPTT: 60 s — ABNORMAL HIGH (ref 24–36)

## 2023-12-27 MED ORDER — SODIUM CHLORIDE 0.9 % IV SOLN
2.0000 g | Freq: Once | INTRAVENOUS | Status: DC
  Filled 2023-12-27: qty 12.5

## 2023-12-27 MED ORDER — NOREPINEPHRINE 4 MG/250ML-% IV SOLN
0.0000 ug/min | INTRAVENOUS | Status: DC
  Administered 2023-12-27: 2 ug/min via INTRAVENOUS
  Administered 2023-12-28: 10 ug/min via INTRAVENOUS
  Administered 2023-12-28: 12 ug/min via INTRAVENOUS
  Administered 2023-12-29: 8 ug/min via INTRAVENOUS
  Administered 2023-12-29 (×2): 5 ug/min via INTRAVENOUS
  Administered 2023-12-30: 19 ug/min via INTRAVENOUS
  Filled 2023-12-27 (×8): qty 250

## 2023-12-27 MED ORDER — FENTANYL CITRATE PF 50 MCG/ML IJ SOSY: 25.0000 ug | PREFILLED_SYRINGE | INTRAMUSCULAR | Status: DC | PRN

## 2023-12-27 MED ORDER — FENTANYL CITRATE PF 50 MCG/ML IJ SOSY
25.0000 ug | PREFILLED_SYRINGE | INTRAMUSCULAR | Status: DC | PRN
  Administered 2023-12-28 – 2023-12-29 (×2): 50 ug via INTRAVENOUS
  Filled 2023-12-27 (×2): qty 1
  Filled 2023-12-27: qty 2
  Filled 2023-12-27: qty 1

## 2023-12-27 MED ORDER — SODIUM CHLORIDE 0.9 % IV SOLN: INTRAVENOUS | Status: AC | PRN

## 2023-12-27 MED ORDER — PIPERACILLIN-TAZOBACTAM 3.375 G IVPB 30 MIN
3.3750 g | Freq: Once | INTRAVENOUS | Status: AC
  Administered 2023-12-27: 3.375 g via INTRAVENOUS

## 2023-12-27 MED ORDER — SODIUM CHLORIDE 0.9 % IV SOLN
2.0000 g | Freq: Four times a day (QID) | INTRAVENOUS | Status: DC
  Administered 2023-12-27 – 2023-12-28 (×3): 2 g via INTRAVENOUS
  Filled 2023-12-27 (×3): qty 2000

## 2023-12-27 MED ORDER — PROTHROMBIN COMPLEX CONC HUMAN 500 UNITS IV KIT
1702.0000 [IU] | PACK | Status: AC
  Administered 2023-12-27: 1702 [IU] via INTRAVENOUS
  Filled 2023-12-27: qty 1702

## 2023-12-27 MED ORDER — ACETAMINOPHEN 325 MG PO TABS: 650.0000 mg | ORAL_TABLET | ORAL | Status: DC | PRN

## 2023-12-27 MED ORDER — ORAL CARE MOUTH RINSE: 15.0000 mL | OROMUCOSAL | Status: DC | PRN

## 2023-12-27 MED ORDER — LEVETIRACETAM (KEPPRA) 500 MG/5 ML ADULT IV PUSH
4500.0000 mg | Freq: Once | INTRAVENOUS | Status: AC
  Administered 2023-12-27: 4500 mg via INTRAVENOUS

## 2023-12-27 MED ORDER — SENNOSIDES-DOCUSATE SODIUM 8.6-50 MG PO TABS
1.0000 | ORAL_TABLET | Freq: Two times a day (BID) | ORAL | Status: DC
  Administered 2023-12-28 – 2023-12-29 (×4): 1
  Filled 2023-12-27 (×4): qty 1

## 2023-12-27 MED ORDER — SODIUM CHLORIDE 0.9 % IV SOLN: 250.0000 mL | INTRAVENOUS | Status: AC

## 2023-12-27 MED ORDER — ACETAMINOPHEN 650 MG RE SUPP
650.0000 mg | Freq: Once | RECTAL | Status: AC
  Administered 2023-12-27: 650 mg via RECTAL
  Filled 2023-12-27: qty 1

## 2023-12-27 MED ORDER — ORAL CARE MOUTH RINSE
15.0000 mL | OROMUCOSAL | Status: DC
  Administered 2023-12-28 – 2023-12-30 (×31): 15 mL via OROMUCOSAL

## 2023-12-27 MED ORDER — ROCURONIUM BROMIDE 10 MG/ML (PF) SYRINGE
PREFILLED_SYRINGE | INTRAVENOUS | Status: AC | PRN
  Administered 2023-12-27: 50 mg via INTRAVENOUS

## 2023-12-27 MED ORDER — LACTATED RINGERS IV BOLUS (SEPSIS)
1000.0000 mL | Freq: Once | INTRAVENOUS | Status: AC
  Administered 2023-12-27: 1000 mL via INTRAVENOUS

## 2023-12-27 MED ORDER — VANCOMYCIN HCL 2000 MG/400ML IV SOLN
2000.0000 mg | Freq: Once | INTRAVENOUS | Status: DC
  Filled 2023-12-27: qty 400

## 2023-12-27 MED ORDER — VANCOMYCIN HCL 1250 MG/250ML IV SOLN: 1250.0000 mg | INTRAVENOUS | Status: DC

## 2023-12-27 MED ORDER — SODIUM CHLORIDE 0.9 % IV SOLN
2.0000 g | Freq: Two times a day (BID) | INTRAVENOUS | Status: DC
  Administered 2023-12-27 – 2023-12-28 (×2): 2 g via INTRAVENOUS
  Filled 2023-12-27 (×2): qty 20

## 2023-12-27 MED ORDER — DOCUSATE SODIUM 50 MG/5ML PO LIQD
100.0000 mg | Freq: Two times a day (BID) | ORAL | Status: DC
  Administered 2023-12-27 – 2023-12-29 (×5): 100 mg
  Filled 2023-12-27 (×5): qty 10

## 2023-12-27 MED ORDER — LACTATED RINGERS IV SOLN: INTRAVENOUS | Status: DC

## 2023-12-27 MED ORDER — CLEVIDIPINE BUTYRATE 0.5 MG/ML IV EMUL
INTRAVENOUS | Status: AC
  Filled 2023-12-27: qty 50

## 2023-12-27 MED ORDER — METRONIDAZOLE 500 MG/100ML IV SOLN
500.0000 mg | Freq: Once | INTRAVENOUS | Status: DC
  Filled 2023-12-27: qty 100

## 2023-12-27 MED ORDER — LACTATED RINGERS IV BOLUS
1000.0000 mL | Freq: Once | INTRAVENOUS | Status: AC
  Administered 2023-12-27: 1000 mL via INTRAVENOUS

## 2023-12-27 MED ORDER — VANCOMYCIN HCL 2000 MG/400ML IV SOLN
2000.0000 mg | Freq: Once | INTRAVENOUS | Status: AC
  Administered 2023-12-27: 2000 mg via INTRAVENOUS
  Filled 2023-12-27: qty 400

## 2023-12-27 MED ORDER — ACETAMINOPHEN 160 MG/5ML PO SOLN
650.0000 mg | ORAL | Status: DC | PRN
  Administered 2023-12-29 – 2023-12-30 (×2): 650 mg
  Filled 2023-12-27 (×2): qty 20.3

## 2023-12-27 MED ORDER — POLYETHYLENE GLYCOL 3350 17 G PO PACK
17.0000 g | PACK | Freq: Every day | ORAL | Status: DC
  Administered 2023-12-28 – 2023-12-29 (×2): 17 g
  Filled 2023-12-27 (×2): qty 1

## 2023-12-27 MED ORDER — VITAMIN K1 10 MG/ML IJ SOLN
10.0000 mg | INTRAVENOUS | Status: AC
  Administered 2023-12-27: 10 mg via INTRAVENOUS
  Filled 2023-12-27: qty 1

## 2023-12-27 MED ORDER — VANCOMYCIN HCL IN DEXTROSE 1-5 GM/200ML-% IV SOLN: 1000.0000 mg | Freq: Once | INTRAVENOUS | Status: DC

## 2023-12-27 MED ORDER — ETOMIDATE 2 MG/ML IV SOLN
INTRAVENOUS | Status: AC | PRN
  Administered 2023-12-27: 20 mg via INTRAVENOUS

## 2023-12-27 MED ORDER — CHLORHEXIDINE GLUCONATE CLOTH 2 % EX PADS
6.0000 | MEDICATED_PAD | Freq: Every day | CUTANEOUS | Status: DC
  Administered 2023-12-28 – 2023-12-30 (×2): 6 via TOPICAL

## 2023-12-27 MED ORDER — PANTOPRAZOLE SODIUM 40 MG IV SOLR: 40.0000 mg | Freq: Every day | INTRAVENOUS | Status: DC

## 2023-12-27 MED ORDER — SODIUM CHLORIDE 0.9 % IV SOLN: 2.0000 g | INTRAVENOUS | Status: DC

## 2023-12-27 MED ORDER — LACTATED RINGERS IV BOLUS (SEPSIS)
500.0000 mL | Freq: Once | INTRAVENOUS | Status: AC
  Administered 2023-12-27: 500 mL via INTRAVENOUS

## 2023-12-27 MED ORDER — FAMOTIDINE 20 MG PO TABS
20.0000 mg | ORAL_TABLET | Freq: Two times a day (BID) | ORAL | Status: DC
  Administered 2023-12-27: 20 mg
  Filled 2023-12-27: qty 1

## 2023-12-27 MED ORDER — SENNOSIDES-DOCUSATE SODIUM 8.6-50 MG PO TABS
1.0000 | ORAL_TABLET | Freq: Two times a day (BID) | ORAL | Status: DC
  Filled 2023-12-27: qty 1

## 2023-12-27 MED ORDER — ACETAMINOPHEN 650 MG RE SUPP: 650.0000 mg | RECTAL | Status: DC | PRN

## 2023-12-27 MED ORDER — DEXTROSE 5 % IV SOLN
10.0000 mg/kg | Freq: Three times a day (TID) | INTRAVENOUS | Status: DC
  Administered 2023-12-27 – 2023-12-28 (×2): 825 mg via INTRAVENOUS
  Filled 2023-12-27 (×4): qty 16.5

## 2023-12-27 MED ORDER — MAGNESIUM SULFATE 2 GM/50ML IV SOLN
2.0000 g | Freq: Once | INTRAVENOUS | Status: AC
  Administered 2023-12-27: 2 g via INTRAVENOUS
  Filled 2023-12-27: qty 50

## 2023-12-27 MED ORDER — PROPOFOL 1000 MG/100ML IV EMUL
0.0000 ug/kg/min | INTRAVENOUS | Status: DC
  Administered 2023-12-27 – 2023-12-28 (×2): 10 ug/kg/min via INTRAVENOUS
  Filled 2023-12-27 (×2): qty 100

## 2023-12-27 MED ORDER — STROKE: EARLY STAGES OF RECOVERY BOOK
Freq: Once | Status: AC
  Filled 2023-12-27: qty 1

## 2023-12-27 MED ORDER — CHLORHEXIDINE GLUCONATE CLOTH 2 % EX PADS
6.0000 | MEDICATED_PAD | Freq: Every day | CUTANEOUS | Status: DC
  Administered 2023-12-27 – 2023-12-29 (×3): 6 via TOPICAL

## 2023-12-27 MED ORDER — IOHEXOL 350 MG/ML SOLN
75.0000 mL | Freq: Once | INTRAVENOUS | Status: AC | PRN
  Administered 2023-12-27: 75 mL via INTRAVENOUS

## 2023-12-27 MED ORDER — MUPIROCIN 2 % EX OINT
1.0000 | TOPICAL_OINTMENT | Freq: Two times a day (BID) | CUTANEOUS | Status: DC
  Administered 2023-12-28 – 2023-12-30 (×5): 1 via NASAL
  Filled 2023-12-27: qty 22

## 2023-12-27 NOTE — ED Triage Notes (Signed)
 Pt to ED with c/o AMS. Unknown LKW. Pt's facility nurse noticed pt was altered around 0900 this am. Pt presents with right-sided weakness and right gaze deviation. Pt maintaining own airway and secretions.

## 2023-12-27 NOTE — Progress Notes (Signed)
 Pt transported from 4N28 to CT & back on the ventilator without any complications.

## 2023-12-27 NOTE — Consult Note (Signed)
   NAME:  Brittany Huffman, MRN:  [id number], DOB:  1950-12-13, LOS: 0 ADMISSION DATE:  12/27/2023, CONSULTATION DATE:  12/26/2021 REFERRING MD:  Classie Cruise MD, CHIEF COMPLAINT:  AMS, Sepsis   History of Present Illness:   73 year old nursing home resident with history of MS, CVA, hypertension, diabetes, hyperlipidemia a fib on coumadin presenting with altered mental status, sepsis, new IPH and stroke.  Elevated lactic acid, INR noted.  Given IV fluids, Kcentra, vitamin K in the ED. PCCM called for for help with management.   Pertinent  Medical History    has a past medical history of Diabetes mellitus without complication (HCC), Hypertension, and Multiple sclerosis (HCC).   Significant Hospital Events: Including procedures, antibiotic start and stop dates in addition to other pertinent events   6/20 Admit  Interim History / Subjective:    Objective    Blood pressure (!) 114/49, pulse 74, temperature 100.1 F (37.8 C), temperature source Rectal, resp. rate (!) 30, weight 104 kg, SpO2 100%.        Intake/Output Summary (Last 24 hours) at 12/27/2023 1501 Last data filed at 12/27/2023 1426 Gross per 24 hour  Intake 2241.82 ml  Output --  Net 2241.82 ml   Filed Weights   12/27/23 1200  Weight: 104 kg    Examination: Gen:      No acute distress, chronically ill-appearing HEENT:  EOMI, sclera anicteric Neck:     No masses; no thyromegaly Lungs:    Clear to auscultation bilaterally; normal respiratory effort CV:         Regular rate and rhythm; no murmurs Abd:      + bowel sounds; soft, non-tender; no palpable masses, no distension Ext:    No edema; adequate peripheral perfusion Neuro: Somnolent, not arousable.  Noted to have right-sided weakness.  Pupils reactive  Labs/Imaging personally reviewed, significant for Sodium 126, BUN/creatinine 34/1.24 Total bilirubin 2.8, WBC 12.4, hemoglobin 11.9, platelets 117 INR 4.0 WBC 12.4, hemoglobin 9.9, platelets 117 Chest x-ray with no  acute cardiopulmonary abnormality CTA with no large residual occlusion CT head with hematoma measuring 2.1 cm in the left frontal lobe, remote infarct in the right parietal lobe, mild chronic microvascular ischemic changes  Resolved problem list   Assessment and Plan  IPH Reverse Coumadin with kcentra and vit K Keppra for seizure prophylaxis Management per neurology  Sepsis, present on admission with unclear etiology Elevated lactic acid Start Vanco, Zosyn Pan scan to look for source of infection IVF resuscitation Start empiric meningitis coverage and consider LP after INR is reversed if no other source of infection is found Follow cultures  Acute respiratory failure due to sepsis Appears in respiratory distress and she will tire out as LA is going up Plan on intubation. Full code per family  AKI, baseline Cr 0.80 Monitor urine output and Cr.    Afib, HTN Tele monitoring Check troponin and echo Hold outpatient HTN meds  Diabetes SSI coverage  Best Practice (right click and Reselect all SmartList Selections daily)   Diet/type: NPO DVT prophylaxis SCD Pressure ulcer(s): N/A GI prophylaxis: PPI Lines: N/A Foley:  N/A Code Status:  full code Last date of multidisciplinary goals of care discussion []   Critical care time:    Ryliegh Mcduffey MD [facility] See Amion for pager  If no response to pager , please call [phone] until 7pm After 7:00 pm call Elink  [phone] 12/27/2023, 3:09 PM

## 2023-12-27 NOTE — Progress Notes (Signed)
 Transported pt from ED23 to CT2 and then 1O10 with bedside RN. Vitals are stable.

## 2023-12-27 NOTE — Progress Notes (Signed)
 Bernerd Bright, RN made aware that PICC will be placed 6/21 and if central access is needed prior to then to reach out to doctor to place a different type of central line.

## 2023-12-27 NOTE — Progress Notes (Signed)
 RN is trying to stabilize patient. Neuro is aware. EEG to be placed when RN contacts Tech.

## 2023-12-27 NOTE — Procedures (Signed)
 Intubation Procedure Note  Brittany Huffman  [id number]  30-Apr-1951  Date:12/27/23  Time:3:30 PM   Provider Performing:Shaunta Oncale D. Harris    Procedure: Intubation (31500)  Indication(s) Respiratory Failure  Consent Risks of the procedure as well as the alternatives and risks of each were explained to the patient and/or caregiver.  Consent for the procedure was obtained and is signed in the bedside chart   Anesthesia Etomidate and Rocuronium   Time Out Verified patient identification, verified procedure, site/side was marked, verified correct patient position, special equipment/implants available, medications/allergies/relevant history reviewed, required imaging and test results available.   Sterile Technique Usual hand hygeine, masks, and gloves were used   Procedure Description Patient positioned in bed supine.  Sedation given as noted above.  Patient was intubated with endotracheal tube using Glidescope.  View was Grade 2 only posterior commissure .  Number of attempts was 1.  Colorimetric CO2 detector was consistent with tracheal placement.   Complications/Tolerance None; patient tolerated the procedure well. Chest X-ray is ordered to verify placement.   EBL Minimal   Specimen(s) None   Hazem Kenner D. Harris, NP-C [facility] Personal contact information can be found on Amion  If no contact or response made please call [phone] 12/27/2023, 3:31 PM

## 2023-12-27 NOTE — Progress Notes (Signed)
 PCCM Update:  Lactic acid is rising. On 7mcg of levophed.  CT Abd/Pelvis read is pending. Abdomen is soft.  Ordered serial lactic acid levels Give 1L LR bolus and continue 75mL/hr of LR after PICC line ordered  Duaine German, MD [facility] Office: [phone]   See Amion for personal pager PCCM on call pager [phone] until 7pm. Please call Elink 7p-7a. [phone]

## 2023-12-27 NOTE — Plan of Care (Signed)
 CT head reviewed, agree with radiology report, stable study

## 2023-12-27 NOTE — Progress Notes (Addendum)
 eLink Physician-Brief Progress Note Patient Name: Jla Reynolds DOB: Dec 19, 1950 MRN: [id number]   Date of Service  12/27/2023  HPI/Events of Note  Bed side RN asking for  CVL/art line by CCM ground team, due to shortage of RT tech. On levo at 6 mcg/min. VS stable. - notified ground team  Camera: Rolling in to CT scan.  Propofol  making MAP above 65 to 67 so far.  HR 82.   In brief; 73 year old nursing home resident with history of MS, CVA, hypertension, diabetes, hyperlipidemia a fib on coumadin presenting with altered mental status, sepsis, new IPH and stroke.  Elevated lactic acid, INR noted.  Given IV fluids, Kcentra , vitamin K  in the ED  AHRF on Vent AKI A fib/HTN, rate 81. 132/63. On Camera evaluation.  Sepsis, unclear. S/p fluids. F/u LA.     eICU Interventions  As above.      Intervention Category Intermediate Interventions: Other:  [name] 12/27/2023, 7:29 PM  22:14 S/p CVL  CxR reviewed. - left internal jugular  crossing mid line, no pneumothorax. Ok to use it.  Discussed with RN. Radiologist also called - to pull back CVL by a cm. Notified ground team.    01:57 Levo is maxed at 10 right now until CVL is pulled back 1cm, RN is asking if u want to add a second pressor or temp go up on the Levo until CVL is able to be used?  - ground team reminded for CVL 1 cm pull back for levo use. For now ok to use levo up to 12 mcg/min while waiting for CVL use. Avoid extravasation.   02:43 RN asking if we want MAP >65 for this patient as DBP is in the 40's driving BP down and she will need to get SBP up pretty high to meet goal. Current BP = 123/43 (63).  - will keep goal as SBP > 120.   03:33 As per ground team CCM, it is ok to use CVL, no need to retract as advised by radiologist. Conveyed same to bed side RN.

## 2023-12-27 NOTE — Sepsis Progress Note (Signed)
 eLink is following this Code Sepsis.

## 2023-12-27 NOTE — Progress Notes (Signed)
 SLP Cancellation Note  Patient Details Name: Corazon Nickolas MRN: [id number] DOB: 12-21-1950   Cancelled treatment:       Reason Eval/Treat Not Completed: Patient not medically ready (intubated). SLP will continue following for readiness to participate in evaluation.    Amil Kale, M.A., CCC-SLP Speech Language Pathology, Acute Rehabilitation Services  Secure Chat preferred [phone]  12/27/2023, 3:33 PM

## 2023-12-27 NOTE — Progress Notes (Signed)
 LTM EEG hooked up and running - no initial skin breakdown - push button tested - [facility] monitoring.

## 2023-12-27 NOTE — ED Provider Notes (Signed)
 [place] EMERGENCY DEPARTMENT AT [facility] Provider Note   CSN: [id number] Arrival date & time: 12/27/23  1251     Patient presents with: Code Stroke   Brittany Huffman is a 73 y.o. female.   Patient brought in by EMS for an episode of decreased responsiveness.  Patient lives at a nursing home.  Patient is reported to have a past medical history of MS previous CVA hypertension diabetes She has had DKA and hyperlipidemia in the past.  Patient's baseline is reported to be responsive and able to carry on a conversation with some confusion.  Patient was last normal at 8 PM last night.  Staff at facility thought patient was just sleeping earlier today.  EMS reports patient is febrile and meets their criteria for sepsis.  They also report patient is having decreased use of her right side.  The history is provided by the patient. No language interpreter was used.       Prior to Admission medications   Medication Sig Start Date End Date Taking? Authorizing Provider  busPIRone (BUSPAR) 5 MG tablet Take 5 mg by mouth 2 (two) times daily. 12/09/23  Yes [provider]  Cranberry 450 MG TABS Take 1 tablet by mouth in the morning and at bedtime.   Yes [provider]  divalproex (DEPAKOTE) 125 MG DR tablet Take 125 mg by mouth daily.   Yes [provider]  divalproex (DEPAKOTE) 250 MG DR tablet Take 250 mg by mouth daily. 12/03/23  Yes [provider]  ergocalciferol (VITAMIN D2) 1.25 MG (50000 UT) capsule Take 50,000 Units by mouth once a week.   Yes [provider]  melatonin 5 MG TABS Take 5 mg by mouth at bedtime.   Yes [provider]  Multiple Vitamin (MULTI-VITAMINS) TABS Take 1 tablet by mouth daily. W/ folic acid 400mcg   Yes [provider]  MYRBETRIQ 50 MG TB24 tablet Take 50 mg by mouth daily. 12/19/23  Yes [provider]  omeprazole (PRILOSEC) 20 MG capsule Take 20 mg by mouth daily.   Yes [provider]  PARoxetine  (PAXIL ) 30 MG tablet Take 30 mg by mouth daily. 12/03/23  Yes [provider]  psyllium (REGULOID) 0.52 g capsule Take 0.52 g by mouth in the morning and at bedtime. 08/19/21  Yes [provider]  solifenacin (VESICARE) 10 MG tablet Take 10 mg by mouth daily. 07/12/23  Yes [provider]  warfarin (COUMADIN) 3 MG tablet Take 3 mg by mouth daily. 12/26/23  Yes [provider]  Amino Acids-Protein Hydrolys (FEEDING SUPPLEMENT, PRO-STAT SUGAR FREE 64,) LIQD Take 30 mLs by mouth 2 (two) times daily.    [provider]  atorvastatin  (LIPITOR) 10 MG tablet Take 1 tablet (10 mg total) by mouth daily. 09/08/19   Lassen, Arlo C, PA-C  bisacodyl (DULCOLAX) 10 MG suppository If not relieved by MOM, give 10 mg Bisacodyl suppositiory rectally X 1 dose in 24 hours as needed (Do not use constipation standing orders for residents with renal failure/CFR less than 30. Contact MD for orders) (Physician Order)    [provider]  dalfampridine  10 MG TB12 Take 1 tablet (10 mg total) by mouth every 12 (twelve) hours. For MS 09/08/19   Lassen, Arlo C, PA-C  Dulaglutide  (TRULICITY ) 0.75 MG/0.5ML SOPN Inject 0.75 mg into the skin once a week. 09/08/19   Lassen, Arlo C, PA-C  lisinopril  (ZESTRIL ) 20 MG tablet Take 1 tablet (20 mg total) by mouth  daily. Patient not taking: Reported on 12/27/2023 09/08/19   Lassen, Arlo C, PA-C  loperamide (IMODIUM A-D) 2 MG tablet Take 2 mg by mouth daily as needed for diarrhea or loose stools.    [provider]  magnesium  hydroxide (MILK OF MAGNESIA) 400 MG/5ML suspension If no BM in 3 days, give 30 cc Milk of Magnesium  p.o. x 1 dose in 24 hours as needed (Do not use standing constipation orders for residents with renal failure CFR less than 30. Contact MD for orders) (Physician Order)    [provider]  NON FORMULARY DIET :Regular NAS,CCD    [provider]  NOVOLOG  FLEXPEN 100 UNIT/ML FlexPen  Inject 1-9 Units into the skin as needed for high blood sugar. AC with SSI 70-120=0 ,121-150=1unit,151-200=2units,201-250=3units,251-300=5units,301-350=7units,351-400=9 units greater than 400 call MD and give 9 units 09/08/19   Lassen, Arlo C, PA-C  nystatin  (NYSTATIN ) powder Apply 1 application topically daily. Apply under abdominal fold, perineal area, and gluteal cleft daily. 09/08/19   Lassen, Arlo C, PA-C    Allergies: Patient has no known allergies.    Review of Systems  Unable to perform ROS: Patient unresponsive    Updated Vital Signs BP (!) 129/112   Pulse 96   Temp (!) 102 F (38.9 C) (Rectal)   Resp (!) 36   Wt 104 kg   SpO2 96%   BMI 32.90 kg/m   Physical Exam Vitals and nursing note reviewed.  Constitutional:      Appearance: She is well-developed. She is ill-appearing and toxic-appearing.  HENT:     Head: Normocephalic.     Right Ear: External ear normal.     Left Ear: External ear normal.     Nose: Nose normal.     Mouth/Throat:     Mouth: Mucous membranes are moist.   Cardiovascular:     Rate and Rhythm: Normal rate and regular rhythm.  Pulmonary:     Effort: Pulmonary effort is normal.  Abdominal:     General: There is no distension.   Musculoskeletal:        General: Normal range of motion.     Cervical back: Normal range of motion.   Skin:    General: Skin is warm.   Neurological:     Comments: Responds to pain  on initial evaluation.  Full neuro assessment by Neurology.     (all labs ordered are listed, but only abnormal results are displayed) Labs Reviewed  PROTIME-INR - Abnormal; Notable for the following components:      Result Value   Prothrombin  Time 38.9 (*)    INR 4.0 (*)    All other components within normal limits  APTT - Abnormal; Notable for the following components:   aPTT 60 (*)    All other components within normal limits  CBC - Abnormal; Notable for the following components:   WBC 12.4 (*)    Hemoglobin 11.9 (*)    HCT  35.0 (*)    Platelets 117 (*)    All other components within normal limits  DIFFERENTIAL - Abnormal; Notable for the following components:   Neutro Abs 11.3 (*)    Lymphs Abs 0.1 (*)    Abs Immature Granulocytes 0.11 (*)    All other components within normal limits  COMPREHENSIVE METABOLIC PANEL WITH GFR - Abnormal; Notable for the following components:   Sodium 127 (*)    Chloride 94 (*)    CO2 16 (*)    Glucose, Bld 199 (*)  BUN 34 (*)    Creatinine, Ser 1.24 (*)    Calcium  8.4 (*)    Total Protein 6.0 (*)    Albumin 2.0 (*)    AST 66 (*)    Total Bilirubin 2.8 (*)    GFR, Estimated 46 (*)    Anion gap 17 (*)    All other components within normal limits  I-STAT CHEM 8, ED - Abnormal; Notable for the following components:   Sodium 126 (*)    Chloride 93 (*)    BUN 35 (*)    Creatinine, Ser 1.20 (*)    Glucose, Bld 201 (*)    Calcium , Ion 1.06 (*)    TCO2 18 (*)    All other components within normal limits  CBG MONITORING, ED - Abnormal; Notable for the following components:   Glucose-Capillary 199 (*)    All other components within normal limits  I-STAT VENOUS BLOOD GAS, ED - Abnormal; Notable for the following components:   pH, Ven 7.546 (*)    pCO2, Ven 22.7 (*)    pO2, Ven 106 (*)    Bicarbonate 19.7 (*)    TCO2 20 (*)    Sodium 126 (*)    Calcium , Ion 1.06 (*)    HCT 35.0 (*)    Hemoglobin 11.9 (*)    All other components within normal limits  CULTURE, BLOOD (ROUTINE X 2)  CULTURE, BLOOD (ROUTINE X 2)  RESP PANEL BY RT-PCR (RSV, FLU A&B, COVID)  RVPGX2  ETHANOL  RAPID URINE DRUG SCREEN, HOSP PERFORMED  PROTIME-INR  I-STAT CG4 LACTIC ACID, ED    EKG: EKG Interpretation Date/Time:  Friday December 27 2023 13:20:47 EDT Ventricular Rate:  96 PR Interval:  115 QRS Duration:  97 QT Interval:  401 QTC Calculation: 507 R Axis:   244  Text Interpretation: Unknown rhythm, irregular rate Borderline short PR interval Left anterior fascicular block Prolonged QT  interval Confirmed by Zackowski, Scott [phone]) on 12/27/2023 1:26:17 PM  Radiology: CT ANGIO HEAD NECK W WO CM (CODE STROKE) Result Date: 12/27/2023 CLINICAL DATA:  Neuro deficit, concern for stroke, parenchymal hemorrhage. EXAM: CT ANGIOGRAPHY HEAD AND NECK WITH AND WITHOUT CONTRAST TECHNIQUE: Multidetector CT imaging of the head and neck was performed using the standard protocol during bolus administration of intravenous contrast. Multiplanar CT image reconstructions and MIPs were obtained to evaluate the vascular anatomy. Carotid stenosis measurements (when applicable) are obtained utilizing NASCET criteria, using the distal internal carotid diameter as the denominator. RADIATION DOSE REDUCTION: This exam was performed according to the departmental dose-optimization program which includes automated exposure control, adjustment of the mA and/or kV according to patient size and/or use of iterative reconstruction technique. CONTRAST:  75mL OMNIPAQUE  IOHEXOL  350 MG/ML SOLN COMPARISON:  Same-day head CT.  Carotid ultrasound 05/11/2017. FINDINGS: CTA NECK FINDINGS Aortic arch: Standard configuration of the aortic arch. Imaged portion shows no evidence of aneurysm or dissection. Mild atherosclerosis of the visualized aortic arch. No significant stenosis of the major arch vessel origins. Pulmonary arteries: As permitted by contrast timing, there are no filling defects in the visualized pulmonary arteries. Subclavian arteries: Patent bilaterally. Atherosclerosis along the proximal aspect of both subclavian arteries without significant stenosis. Right carotid system: Patent from the origin to the skull base. Motion artifact slightly limits evaluation of the vessel origin. Calcified and noncalcified atherosclerosis at the carotid bifurcation resulting in approximately 40% stenosis at the origin of the cervical ICA. Additional bulky calcified atherosclerosis along the proximal cervical ICA without significant stenosis.  Left carotid system: Patent from the origin to the skull base. Mild atherosclerosis at the vessel origin without stenosis. Additional calcified atherosclerosis at the carotid bifurcation resulting in approximately 40% stenosis at the origin of the cervical ICA. Tortuosity of the distal cervical ICA. Vertebral arteries: Codominant. Patent from the origins to the vertebrobasilar confluence. Atherosclerosis at the left vertebral artery origin resulting in severe stenosis. No significant stenosis at the origin of the right vertebral artery. No dissection. Skeleton: No acute findings. Degenerative changes in the cervical spine. Significant disc space narrowing at C4-5 through C6-7. Edentulous maxilla and mandible. Other neck: The visualized airway is patent. No cervical lymphadenopathy. Multiple thyroid nodules including a calcified left thyroid nodule measuring up to 2.1 cm. Upper chest: Visualized lung apices are clear. Review of the MIP images confirms the above findings CTA HEAD FINDINGS ANTERIOR CIRCULATION: The intracranial ICAs are patent bilaterally. Atherosclerosis throughout the carotid siphons. Mild-to-moderate stenosis of the bilateral paraclinoid ICAs. No severe stenosis, proximal occlusion, aneurysm, or vascular malformation. MCAs: Patent bilaterally. There is diminutive caliber of right MCA branches within the posterior aspect of the right MCA territory likely related to prior infarct. Irregularity and mild narrowing of the left M1 segment. There is irregularity and moderate multifocal narrowing of a proximal M2 branch of the left MCA. ACAs: The anterior cerebral arteries are patent bilaterally. POSTERIOR CIRCULATION: No significant stenosis, proximal occlusion, aneurysm, or vascular malformation. PCAs: The posterior cerebral arteries are patent bilaterally. Pcomm: Visualized on the right. SCAs: The superior cerebellar arteries are patent bilaterally. Basilar artery: Patent. Mild narrowing of the distal  basilar artery. AICAs: Patent PICAs: Patent Vertebral arteries: The intracranial vertebral arteries are patent. Venous sinuses: As permitted by contrast timing, patent. Anatomic variants: None Review of the MIP images confirms the above findings IMPRESSION: No large vessel occlusion. Atherosclerosis at the carotid bifurcations resulting in approximately 40% stenosis at the origins of both cervical ICAs. Severe stenosis at the origin of the left vertebral artery. Atherosclerosis of the carotid siphons resulting in mild-to-moderate stenosis. Irregularity and mild stenosis of the left M1 segment. Additional moderate multifocal stenosis of a proximal M2 branch of the left MCA. Multiple thyroid nodules including a 2.1 cm calcified nodule. Recommend correlation with nonemergent thyroid ultrasound if not previously performed. Aortic Atherosclerosis (ICD10-I70.0). Electronically Signed   By: Donnice Mania M.D.   On: 12/27/2023 13:39   CT HEAD CODE STROKE WO CONTRAST Result Date: 12/27/2023 CLINICAL DATA:  Code stroke.  Neuro deficit, concern for stroke. EXAM: CT HEAD WITHOUT CONTRAST TECHNIQUE: Contiguous axial images were obtained from the base of the skull through the vertex without intravenous contrast. RADIATION DOSE REDUCTION: This exam was performed according to the departmental dose-optimization program which includes automated exposure control, adjustment of the mA and/or kV according to patient size and/or use of iterative reconstruction technique. COMPARISON:  MRI head 04/01/2023, CT head 12/12/2023. FINDINGS: Brain: 2.1 x 1.4 x 1.6 cm parenchymal hematoma in the posterior left frontal lobe likely involving the precentral gyrus with surrounding edema and mild local mass effect. No midline shift. Redemonstrated encephalomalacia in the right parietal lobe extending into the posterior aspect of the right superior temporal gyrus compatible with remote infarct. Nonspecific hypoattenuation in the periventricular and  subcortical white matter favored to reflect chronic microvascular ischemic changes. Generalized parenchymal volume loss. The basilar cisterns are patent. Ventricles: Prominence of the ventricles suggesting underlying parenchymal volume loss. Vascular: Atherosclerotic calcifications of the carotid siphons. No hyperdense vessel. Skull: No acute or aggressive finding. Orbits: Right  lens replacement.  Orbits otherwise unremarkable. Sinuses: The visualized paranasal sinuses are clear. Other: Mastoid air cells are clear. ASPECTS ([place]berta Stroke Program Early CT Score) - Ganglionic level infarction (caudate, lentiform nuclei, internal capsule, insula, M1-M3 cortex): 7 - Supraganglionic infarction (M4-M6 cortex): 3 Total score (0-10 with 10 being normal): 10 IMPRESSION: 1. 2.1 cm parenchymal hematoma in the posterior left frontal lobe with mild surrounding edema and local mass effect. No midline shift. An underlying hemorrhagic lesion cannot be excluded although this is considered less likely given the normal appearance of this region on MRI from September 2024. Consider repeat MRI head with and without contrast after resolution of acute episode. 2. Remote infarct in the right parietal temporal lobes. 3. Mild chronic microvascular ischemic changes and generalized parenchymal volume loss. 4. ASPECTS is 10 Finding of left frontal hematoma communicated to Dr. Vanessa at 1:15 pm on 12/27/2023 by text page via the [facility] messaging system. Electronically Signed   By: Donnice Mania M.D.   On: 12/27/2023 13:15     .Critical Care  Performed by: Flint Sonny POUR, PA-C Authorized by: Flint Sonny POUR, PA-C   Critical care provider statement:    Critical care time (minutes):  30   Critical care start time:  12/27/2023 1:00 PM   Critical care end time:  12/27/2023 2:28 PM   Critical care time was exclusive of:  Separately billable procedures and treating other patients   Critical care was necessary to treat or prevent imminent  or life-threatening deterioration of the following conditions:  Cardiac failure, circulatory failure, respiratory failure, sepsis and CNS failure or compromise   Critical care was time spent personally by me on the following activities:  Discussions with consultants, evaluation of patient's response to treatment, interpretation of cardiac output measurements, review of old charts, pulse oximetry and ordering and review of radiographic studies   Care discussed with: admitting provider      Medications Ordered in the ED  lactated ringers  infusion (has no administration in time range)  lactated ringers  bolus 1,000 mL (0 mLs Intravenous Stopped 12/27/23 1349)    And  lactated ringers  bolus 1,000 mL (1,000 mLs Intravenous New Bag/Given 12/27/23 1345)    And  lactated ringers  bolus 1,000 mL (1,000 mLs Intravenous New Bag/Given 12/27/23 1348)    And  lactated ringers  bolus 500 mL (has no administration in time range)  prothrombin  complex conc human (KCENTRA ) IVPB 1,702 Units (1,702 Units Intravenous New Bag/Given 12/27/23 1335)  phytonadione  (VITAMIN K ) 10 mg in dextrose  5 % 50 mL IVPB (has no administration in time range)  acetaminophen  (TYLENOL ) suppository 650 mg (has no administration in time range)  vancomycin  (VANCOREADY) IVPB 2000 mg/400 mL (has no administration in time range)  piperacillin -tazobactam (ZOSYN ) IVPB 3.375 g (3.375 g Intravenous New Bag/Given 12/27/23 1346)  levETIRAcetam  (KEPPRA ) undiluted injection 4,500 mg (4,500 mg Intravenous Given 12/27/23 1316)  iohexol  (OMNIPAQUE ) 350 MG/ML injection 75 mL (75 mLs Intravenous Contrast Given 12/27/23 1312)                                    Medical Decision Making Patient was brought in as a code stroke.  Patient was last known well at 8 PM yesterday.  Amount and/or Complexity of Data Reviewed Independent Historian: EMS    Details: EMS provides history of patient having AMS and being alert and able to communicate at her baseline Labs:  ordered. Decision-making details  documented in ED Course.    Details: Labs ordered reviewed and interpreted, patient has a INR of 4.0 white blood cell count is 12.4 hemoglobin is 11.9 Radiology: ordered and independent interpretation performed. Decision-making details documented in ED Course.    Details: CT head shows 2.1 cm parenchymal hematoma. ECG/medicine tests: ordered and independent interpretation performed. Decision-making details documented in ED Course. Discussion of management or test interpretation with external provider(s): Dr. Vanessa neurology at bedside on arrival.  He will admit.  Sepsis orders placed.  Pt given IV fluids and IV antibiotics.  Critical care consulted and will also see patient.   Risk OTC drugs. Prescription drug management.        Final diagnoses:  Sepsis, due to unspecified organism, unspecified whether acute organ dysfunction present [facility])  Intraparenchymal hematoma of brain, left, with unknown loss of consciousness status, initial encounter [facility])    ED Discharge Orders     None          Flint Sonny MARLA DEVONNA 12/27/23 1434    Geraldene Hamilton, MD 12/27/2023 2057

## 2023-12-27 NOTE — ED Notes (Signed)
 CCM called for tele admit.

## 2023-12-27 NOTE — Progress Notes (Addendum)
 Pharmacy Antibiotic Note  Brittany Huffman is a 73 y.o. female admitted on 12/27/2023 with r/o meningitis.  Pharmacy has been consulted for Vancomycin, Rocephin, Ampicillin, Acyclovir dosing.  Pt received vanc load 2gm in ED and 3.375gm Zosyn  Plan: Vancomycin 1250mg  IV q24h (regular trough dosing for CNS coverage) Rocephin 2gm IV q12h Ampicillin 2 gm IV q6h Acyclovir 825 mg (10mg /kg adjBW) q8h Will f/u renal function, micro data, and pt's clinical condition Vanc trough at Css  Height: 5' 10 (177.8 cm) Weight: 104 kg (229 lb 4.5 oz) IBW/kg (Calculated) : 68.5  Temp (24hrs), Avg:100.4 F (38 C), Min:97.6 F (36.4 C), Max:102 F (38.9 C)  Recent Labs  Lab 12/27/23 1254 12/27/23 1258 12/27/23 1329 12/27/23 1342 12/27/23 1448  WBC 12.4*  --   --   --   --   CREATININE 1.24* 1.20*  --   --   --   LATICACIDVEN  --   --  5.2* 5.4* 7.6*    Estimated Creatinine Clearance: 54.5 mL/min (A) (by C-G formula based on SCr of 1.2 mg/dL (H)).    No Known Allergies  Antimicrobials this admission: 6/20 Zosyn x 1 6/20 Vanc >>  6/20 Rocephin >>  6/20 Acyclovir >> 6/20 Ampicillin >>  Microbiology results: 6/20 BCx:  6/20 MRSA PCR:   Thank you for allowing pharmacy to be a part of this patient's care.  Enrigue Harvard, PharmD, BCPS Please see amion for complete clinical pharmacist phone list 12/27/2023 8:16 PM

## 2023-12-27 NOTE — Procedures (Signed)
 Central Venous Catheter Insertion Procedure Note  Brittany Huffman  [id number]  06/22/1951  Date:12/27/23  Time:10:37 PM   Provider Performing:Fidelis Loth R Mellisa Arshad   Procedure: Insertion of Non-tunneled Central Venous Catheter(36556) with US  guidance (95621)   Indication(s) Medication administration  Consent Risks of the procedure as well as the alternatives and risks of each were explained to the patient and/or caregiver.  Consent for the procedure was obtained and is signed in the bedside chart  Anesthesia Topical only with 1% lidocaine   Timeout Verified patient identification, verified procedure, site/side was marked, verified correct patient position, special equipment/implants available, medications/allergies/relevant history reviewed, required imaging and test results available.  Sterile Technique Maximal sterile technique including full sterile barrier drape, hand hygiene, sterile gown, sterile gloves, mask, hair covering, sterile ultrasound probe cover (if used).  Procedure Description Area of catheter insertion was cleaned with chlorhexidine and draped in sterile fashion.  With real-time ultrasound guidance a central venous catheter was placed into the left internal jugular vein. Nonpulsatile blood flow and easy flushing noted in all ports.  The catheter was sutured in place and sterile dressing applied.  Complications/Tolerance None; patient tolerated the procedure well. Chest X-ray is ordered to verify placement for internal jugular or subclavian cannulation.   Chest x-ray is not ordered for femoral cannulation.  EBL Minimal  Specimen(s) None    Brittany Huffman Brittany Meller, PA-C

## 2023-12-27 NOTE — H&P (Signed)
 NEUROLOGY H&P NOTE   Date of service: December 27, 2023 Patient Name: Brittany Huffman MRN:  [id number] DOB:  [birth year] Chief Complaint: code stroke  History of Present Illness  Brittany Huffman is a 73 y.o. female with hx of DM2, HTN, MS, bedbound and in a SNF at baseline, afibb on warfarin who was last seen normal yesterday by her team. This AM, was noted by staff at her facility to be sleepy and R gaze but EMS called in the afternoon. It is unclear how and if this was missed.  On arrival, aphasic, weak all over but more so on the right than left and gaze deviation to the right. CT Head with L parietal small ICH.  She is febrile to Tmax of 102. Lethargic and drowsy.  She was brought in as a code stroke.  CT Head with small L parietal ICH. CTA with no spot sign.  Labs with INR of 4. Elevated lactate. Facility [facility] reviewed and shows that patient was given 3mg  of warfarin at 1700 yesterday.  Last known well: unknown but sometime last night? Modified rankin score: 4-Needs assistance to walk and tend to bodily needs ICH Score: 1 tNKASE: Not offered due to ICH Thrombectomy: not offered due to ICH NIHSS components Score: Comment  1a Level of Conscious 0[x]  1[]  2[]  3[]      1b LOC Questions 0[]  1[]  2[x]       1c LOC Commands 0[]  1[]  2[x]       2 Best Gaze 0[]  1[]  2[x]       3 Visual 0[]  1[]  2[x]  3[]      4 Facial Palsy 0[]  1[]  2[x]  3[]      5a Motor Arm - left 0[]  1[]  2[x]  3[]  4[]  UN[]    5b Motor Arm - Right 0[]  1[]  2[]  3[]  4[x]  UN[]    6a Motor Leg - Left 0[]  1[]  2[]  3[x]  4[]  UN[]    6b Motor Leg - Right 0[]  1[]  2[]  3[x]  4[]  UN[]    7 Limb Ataxia 0[x]  1[]  2[]  UN[]      8 Sensory 0[]  1[]  2[x]  UN[]      9 Best Language 0[]  1[]  2[]  3[x]      10 Dysarthria 0[]  1[]  2[x]  UN[]      11 Extinct. and Inattention 0[x]  1[]  2[]       TOTAL: 29      ROS  Unable to perform due to aphasia/mute.  Past History   Past Medical History:  Diagnosis Date   Diabetes mellitus without complication (HCC)    Pt takes  insulin .   Hypertension    Multiple sclerosis (HCC)    History reviewed. No pertinent surgical history. History reviewed. No pertinent family history. Social History   Socioeconomic History   Marital status: Widowed    Spouse name: Not on file   Number of children: Not on file   Years of education: Not on file   Highest education level: Not on file  Occupational History   Not on file  Tobacco Use   Smoking status: Never   Smokeless tobacco: Never  Vaping Use   Vaping status: Never Used  Substance and Sexual Activity   Alcohol use: No   Drug use: No   Sexual activity: Never    Partners: Male  Other Topics Concern   Not on file  Social History Narrative   Not on file   Social Drivers of Health   Financial Resource Strain: Not on file  Food Insecurity: Not on file  Transportation Needs: Not on file  Physical Activity: Not  on file  Stress: Not on file  Social Connections: Not on file   No Known Allergies  Medications  (Not in a hospital admission)    Vitals   Vitals:   12/27/23 1500 12/27/23 1515 12/27/23 1530 12/27/23 1540  BP: 102/69  103/62 129/63  Pulse: 85  91 81  Resp: (!) 34  16 16  Temp:      TempSrc:      SpO2: 99%  98% 100%  Weight:      Height:  5' 10 (1.778 m)       Body mass index is 32.9 kg/m.  Physical Exam   General: Laying comfortably in bed; in no acute distress.  HENT: Normal oropharynx and mucosa. Normal external appearance of ears and nose.  Neck: Supple, no pain or tenderness  CV: No JVD. No peripheral edema.  Pulmonary: Symmetric Chest rise. tachypneic Abdomen: Soft to touch, non-tender.  Ext: No cyanosis, edema, or deformity  Skin: No rash. Normal palpation of skin.   Musculoskeletal: Normal digits and nails by inspection. No clubbing.   Neurologic Examination  Mental status/Cognition: Alert, R gaze deviation, mute, no speech. Cranial nerves:   CN II Pupils equal and reactive to light, R gaze deviation. Does not blink  to threat on the right.   CN III,IV,VI R gaze deviation   CN V Corneals intact BL   CN VII L facial droop   CN VIII Does not make eye contact to speech   CN IX & X Weak but noted gag.   CN XI Head midlein   CN XII Does not protrude tongue on command.   Motor:  Muscle bulk: normal, tone normal. antigravity movement in LUE, no movement in RUE. Slight movement in BL lower extremities. Localizes to pinch in LUE.  Coordination/Complex Motor:  - Unable to assess.  Labs   CBC:  Recent Labs  Lab 12/27/23 1254 12/27/23 1258 12/27/23 1328  WBC 12.4*  --   --   NEUTROABS 11.3*  --   --   HGB 11.9* 12.2 11.9*  HCT 35.0* 36.0 35.0*  MCV 83.7  --   --   PLT 117*  --   --     Basic Metabolic Panel:  Lab Results  Component Value Date   NA 126 (L) 12/27/2023   K 3.7 12/27/2023   CO2 16 (L) 12/27/2023   GLUCOSE 201 (H) 12/27/2023   BUN 35 (H) 12/27/2023   CREATININE 1.20 (H) 12/27/2023   CALCIUM  8.4 (L) 12/27/2023   GFRNONAA 46 (L) 12/27/2023   GFRAA >60 05/10/2017   Lipid Panel:  Lab Results  Component Value Date   LDLCALC 70 05/11/2017   HgbA1c:  Lab Results  Component Value Date   HGBA1C 8.3 (H) 05/11/2017   Urine Drug Screen: No results found for: LABOPIA, COCAINSCRNUR, LABBENZ, AMPHETMU, THCU, LABBARB  Alcohol Level     Component Value Date/Time   ETH <15 12/27/2023 1254   INR  Lab Results  Component Value Date   INR 1.6 (H) 12/27/2023   APTT  Lab Results  Component Value Date   APTT 60 (H) 12/27/2023    CT Head without contrast(Personally reviewed): 1. 2.1 cm parenchymal hematoma in the posterior left frontal lobe with mild surrounding edema and local mass effect. No midline shift. An underlying hemorrhagic lesion cannot be excluded although this is considered less likely given the normal appearance of this region on MRI from September 2024. Consider repeat MRI head with and without contrast  after resolution of acute episode. 2. Remote  infarct in the right parietal temporal lobes. 3. Mild chronic microvascular ischemic changes and generalized parenchymal volume loss. 4. ASPECTS is 10  CT angio Head and Neck with contrast(Personally reviewed): No spot sign  MRI Brain: pending  cEEG:  pending  Impression   Brittany Huffman is a 73 y.o. female with hx of DM2, HTN, MS, bedbound and in a SNF at baseline, afibb on warfarin who was last seen normal yesterday by her team. This AM, was noted by staff at her facility to be sleepy and R gaze. EMS called in the afternoon. On arrival, noted R gaze and right worse than left weakness, aphasia. Raising suspicion for seizure. CT Head with L parietal hemorrhage with ICH score of 1 for (GCS 5-12). Warfarin was last administered at 1700 on 12/26/23. Warfarin reversed with Kcentra with repeat INR of 1.6.spontaneously maintaining BP within goal of 130-150.  Presentation discussed with daughter. Also noted elevated lactate with fever with rectal temp of 102. Source unclear, possible urinary based on CTA. Cannot exclude meningitis.  Primary Diagnosis:  Nontraumatic intracerebral hemorrhage in hemisphere, cortical  Recommendations  L parietal ICH: - Admit to ICU - Stability scan in 6 hours or STAT with any neurological decline - Frequent neuro checks; q55min for 1 hour, then q1hour - No antiplatelets or anticoagulants due to ICH - SCD for DVT prophylaxis, pharmacological DVT ppx at 24 hours if ICH is stable - Blood pressure control with goal systolic 130 - 150, cleverplex and labetalol PRN - Stroke labs, HgbA1c, fasting lipid panel - MRI brain with and without contrast when stabilized to evaluate for underlying mass - Risk factor modification - Echocardiogram - PT consult, OT consult, Speech consult. - Warfarin was reversed with Kcentra with INR of 4.0 on presentation. Repeat INR of 1.6. - Stroke team to follow  Sepsis of unclear origin: - appreciate PCCM team for assistance with  management. - empiric meningitis coverage.  Allergies discussed with daughter and code status discussed with daughter and patient is FULL CODE for now. ______________________________________________________________________  This patient is critically ill and at significant risk of neurological worsening, death and care requires constant monitoring of vital signs, hemodynamics,respiratory and cardiac monitoring, neurological assessment, discussion with family, other specialists and medical decision making of high complexity. I spent 70 minutes of neurocritical care time  in the care of  this patient. This was time spent independent of any time provided by nurse practitioner or PA.  Bannon Giammarco [facility] 12/27/2023  8:16 PM  Signed,  Allen Egerton, MD [facility]

## 2023-12-27 NOTE — Sepsis Progress Note (Signed)
 Notified provider of need to order another repeat lactic acid as the prior 3 keep increasing.

## 2023-12-27 NOTE — Progress Notes (Signed)
 Pt headed to 4N soon. LTM EEG will be placed when patient arrives.

## 2023-12-27 NOTE — Code Documentation (Signed)
 Stroke Response Nurse Documentation Code Documentation  Brittany Huffman is a 73 y.o. female arriving to [facility]  via [facility] on 6/20 with past medical hx of MS, CVA, DM, HTN. On warfarin daily. Code stroke was activated by EMS.   Patient from [facility] where she was LKW at unknown time, but likely yesterday and now complaining of AMS, R gaze, R weakness.    Stroke team at the bedside on patient arrival. Labs drawn and patient cleared for CT by EDP. Patient to CT with team. NIHSS 29, see documentation for details and code stroke times. Patient with disoriented, not following commands, right gaze preference , left hemianopia, left facial droop, bilateral arm weakness, bilateral leg weakness, right decreased sensation, Global aphasia , dysarthria , and Visual  neglect on exam. The following imaging was completed:  CT Head. Patient is not a candidate for IV Thrombolytic due to ICH. Patient is not a candidate for IR due to ICH.   Care Plan: Warfarin reversal. Q1hr NIHSS with pupil assessment and vital signs. BP goal 130-150.    Bedside handoff with ED RN Hayden Lipoma.    Marlon Simpson  Stroke Response RN

## 2023-12-27 NOTE — Procedures (Signed)
 Arterial Catheter Insertion Procedure Note  Brittany Huffman  [id number]  04/26/1951  Date:12/27/23  Time:10:38 PM    Provider Performing: [name] Jordann Grime    Procedure: Insertion of Arterial Line (28413) with US  guidance (24401)   Indication(s) Blood pressure monitoring and/or need for frequent ABGs  Consent Risks of the procedure as well as the alternatives and risks of each were explained to the patient and/or caregiver.  Consent for the procedure was obtained and is signed in the bedside chart  Anesthesia Lidocaine   Time Out Verified patient identification, verified procedure, site/side was marked, verified correct patient position, special equipment/implants available, medications/allergies/relevant history reviewed, required imaging and test results available.   Sterile Technique Maximal sterile technique including full sterile barrier drape, hand hygiene, sterile gown, sterile gloves, mask, hair covering, sterile ultrasound probe cover (if used).   Procedure Description Area of catheter insertion was cleaned with chlorhexidine and draped in sterile fashion. With real-time ultrasound guidance an arterial catheter was placed into the right axillary  artery.  Appropriate arterial tracings confirmed on monitor.     Complications/Tolerance None; patient tolerated the procedure well.   EBL Minimal   Specimen(s) None   [name] [name], PA-C

## 2023-12-28 ENCOUNTER — Inpatient Hospital Stay (HOSPITAL_COMMUNITY)

## 2023-12-28 DIAGNOSIS — N179 Acute kidney failure, unspecified: Secondary | ICD-10-CM

## 2023-12-28 DIAGNOSIS — I611 Nontraumatic intracerebral hemorrhage in hemisphere, cortical: Secondary | ICD-10-CM | POA: Diagnosis not present

## 2023-12-28 DIAGNOSIS — R7881 Bacteremia: Secondary | ICD-10-CM | POA: Diagnosis not present

## 2023-12-28 DIAGNOSIS — R29721 NIHSS score 21: Secondary | ICD-10-CM

## 2023-12-28 DIAGNOSIS — I6389 Other cerebral infarction: Secondary | ICD-10-CM | POA: Diagnosis not present

## 2023-12-28 DIAGNOSIS — R579 Shock, unspecified: Secondary | ICD-10-CM

## 2023-12-28 DIAGNOSIS — B9561 Methicillin susceptible Staphylococcus aureus infection as the cause of diseases classified elsewhere: Secondary | ICD-10-CM | POA: Diagnosis not present

## 2023-12-28 DIAGNOSIS — N390 Urinary tract infection, site not specified: Secondary | ICD-10-CM

## 2023-12-28 DIAGNOSIS — I38 Endocarditis, valve unspecified: Secondary | ICD-10-CM

## 2023-12-28 DIAGNOSIS — A4901 Methicillin susceptible Staphylococcus aureus infection, unspecified site: Secondary | ICD-10-CM

## 2023-12-28 DIAGNOSIS — R569 Unspecified convulsions: Secondary | ICD-10-CM | POA: Diagnosis not present

## 2023-12-28 DIAGNOSIS — J9601 Acute respiratory failure with hypoxia: Secondary | ICD-10-CM

## 2023-12-28 DIAGNOSIS — E876 Hypokalemia: Secondary | ICD-10-CM

## 2023-12-28 DIAGNOSIS — I6340 Cerebral infarction due to embolism of unspecified cerebral artery: Secondary | ICD-10-CM

## 2023-12-28 DIAGNOSIS — A4101 Sepsis due to Methicillin susceptible Staphylococcus aureus: Secondary | ICD-10-CM | POA: Diagnosis not present

## 2023-12-28 DIAGNOSIS — J9690 Respiratory failure, unspecified, unspecified whether with hypoxia or hypercapnia: Secondary | ICD-10-CM

## 2023-12-28 DIAGNOSIS — R6521 Severe sepsis with septic shock: Secondary | ICD-10-CM

## 2023-12-28 LAB — POCT I-STAT 7, (LYTES, BLD GAS, ICA,H+H)
Acid-base deficit: 5 mmol/L — ABNORMAL HIGH (ref 0.0–2.0)
Bicarbonate: 17.2 mmol/L — ABNORMAL LOW (ref 20.0–28.0)
Calcium, Ion: 1.11 mmol/L — ABNORMAL LOW (ref 1.15–1.40)
HCT: 30 % — ABNORMAL LOW (ref 36.0–46.0)
Hemoglobin: 10.2 g/dL — ABNORMAL LOW (ref 12.0–15.0)
O2 Saturation: 100 %
Patient temperature: 98.2
Potassium: 3.5 mmol/L (ref 3.5–5.1)
Sodium: 125 mmol/L — ABNORMAL LOW (ref 135–145)
TCO2: 18 mmol/L — ABNORMAL LOW (ref 22–32)
pCO2 arterial: 22.2 mmHg — ABNORMAL LOW (ref 32–48)
pH, Arterial: 7.496 — ABNORMAL HIGH (ref 7.35–7.45)
pO2, Arterial: 160 mmHg — ABNORMAL HIGH (ref 83–108)

## 2023-12-28 LAB — BLOOD CULTURE ID PANEL (REFLEXED) - BCID2

## 2023-12-28 LAB — URINALYSIS, W/ REFLEX TO CULTURE (INFECTION SUSPECTED)
Bilirubin Urine: NEGATIVE
Glucose, UA: 50 mg/dL — AB
Ketones, ur: NEGATIVE mg/dL
Nitrite: NEGATIVE
Protein, ur: 100 mg/dL — AB
RBC / HPF: >50 RBC/hpf (ref 0–5)
Specific Gravity, Urine: 1.032 — ABNORMAL HIGH (ref 1.005–1.030)
WBC, UA: >50 WBC/hpf (ref 0–5)
pH: 5 (ref 5.0–8.0)

## 2023-12-28 LAB — BASIC METABOLIC PANEL WITH GFR
Anion gap: 13 (ref 5–15)
BUN: 40 mg/dL — ABNORMAL HIGH (ref 8–23)
CO2: 16 mmol/L — ABNORMAL LOW (ref 22–32)
Calcium: 7.8 mg/dL — ABNORMAL LOW (ref 8.9–10.3)
Chloride: 97 mmol/L — ABNORMAL LOW (ref 98–111)
Creatinine, Ser: 1.34 mg/dL — ABNORMAL HIGH (ref 0.44–1.00)
GFR, Estimated: 42 mL/min — ABNORMAL LOW (ref >60)
Glucose, Bld: 251 mg/dL — ABNORMAL HIGH (ref 70–99)
Potassium: 3.2 mmol/L — ABNORMAL LOW (ref 3.5–5.1)
Sodium: 126 mmol/L — ABNORMAL LOW (ref 135–145)

## 2023-12-28 LAB — LIPID PANEL
Cholesterol: 105 mg/dL (ref 0–200)
HDL: <10 mg/dL — ABNORMAL LOW (ref >40)
Triglycerides: 254 mg/dL — ABNORMAL HIGH (ref <150)
VLDL: 51 mg/dL — ABNORMAL HIGH (ref 0–40)

## 2023-12-28 LAB — GLUCOSE, CAPILLARY
Glucose-Capillary: 288 mg/dL — ABNORMAL HIGH (ref 70–99)
Glucose-Capillary: 291 mg/dL — ABNORMAL HIGH (ref 70–99)
Glucose-Capillary: 292 mg/dL — ABNORMAL HIGH (ref 70–99)
Glucose-Capillary: 317 mg/dL — ABNORMAL HIGH (ref 70–99)

## 2023-12-28 LAB — CBC
HCT: 30.1 % — ABNORMAL LOW (ref 36.0–46.0)
Hemoglobin: 10 g/dL — ABNORMAL LOW (ref 12.0–15.0)
MCH: 28.4 pg (ref 26.0–34.0)
MCHC: 33.2 g/dL (ref 30.0–36.0)
MCV: 85.5 fL (ref 80.0–100.0)
Platelets: 109 10*3/uL — ABNORMAL LOW (ref 150–400)
RBC: 3.52 MIL/uL — ABNORMAL LOW (ref 3.87–5.11)
RDW: 15.6 % — ABNORMAL HIGH (ref 11.5–15.5)
WBC: 18.9 10*3/uL — ABNORMAL HIGH (ref 4.0–10.5)
nRBC: 0 % (ref 0.0–0.2)

## 2023-12-28 LAB — ECHOCARDIOGRAM COMPLETE
AR max vel: 1.39 cm2
AV Area VTI: 1.56 cm2
AV Area mean vel: 1.37 cm2
AV Mean grad: 9 mmHg
AV Peak grad: 17.5 mmHg
Ao pk vel: 2.09 m/s
Calc EF: 81.2 %
Est EF: >75
Height: 70 in
S' Lateral: 2.5 cm
Single Plane A2C EF: 87 %
Single Plane A4C EF: 77.8 %
Weight: 3668.45 [oz_av]

## 2023-12-28 LAB — PHOSPHORUS
Phosphorus: 3.1 mg/dL (ref 2.5–4.6)
Phosphorus: 3.2 mg/dL (ref 2.5–4.6)

## 2023-12-28 LAB — LACTIC ACID, PLASMA
Lactic Acid, Venous: 2.7 mmol/L (ref 0.5–1.9)
Lactic Acid, Venous: 3.1 mmol/L (ref 0.5–1.9)

## 2023-12-28 LAB — HEMOGLOBIN A1C
Hgb A1c MFr Bld: 7.6 % — ABNORMAL HIGH (ref 4.8–5.6)
Mean Plasma Glucose: 171.42 mg/dL

## 2023-12-28 LAB — PROTIME-INR
INR: 1.3 — ABNORMAL HIGH (ref 0.8–1.2)
Prothrombin Time: 16.2 s — ABNORMAL HIGH (ref 11.4–15.2)

## 2023-12-28 LAB — TRIGLYCERIDES: Triglycerides: 261 mg/dL — ABNORMAL HIGH (ref <150)

## 2023-12-28 LAB — MAGNESIUM
Magnesium: 1.9 mg/dL (ref 1.7–2.4)
Magnesium: 1.8 mg/dL (ref 1.7–2.4)

## 2023-12-28 MED ORDER — SODIUM CHLORIDE 0.9 % IV SOLN
2.0000 g | INTRAVENOUS | Status: DC
  Administered 2023-12-29: 2 g via INTRAVENOUS
  Filled 2023-12-28 (×2): qty 20

## 2023-12-28 MED ORDER — INSULIN ASPART 100 UNIT/ML IJ SOLN
0.0000 [IU] | INTRAMUSCULAR | Status: DC
  Administered 2023-12-28 (×3): 5 [IU] via SUBCUTANEOUS
  Administered 2023-12-29: 9 [IU] via SUBCUTANEOUS
  Administered 2023-12-29: 7 [IU] via SUBCUTANEOUS

## 2023-12-28 MED ORDER — DEXTROSE 5 % IV SOLN
10.0000 mg/kg | Freq: Two times a day (BID) | INTRAVENOUS | Status: DC
  Filled 2023-12-28: qty 16.5

## 2023-12-28 MED ORDER — MAGNESIUM SULFATE IN D5W 1-5 GM/100ML-% IV SOLN
1.0000 g | Freq: Once | INTRAVENOUS | Status: AC
  Administered 2023-12-28: 1 g via INTRAVENOUS
  Filled 2023-12-28: qty 100

## 2023-12-28 MED ORDER — PERFLUTREN LIPID MICROSPHERE
1.0000 mL | INTRAVENOUS | Status: AC | PRN
  Administered 2023-12-28: 3 mL via INTRAVENOUS

## 2023-12-28 MED ORDER — PROSOURCE TF20 ENFIT COMPATIBL EN LIQD: 60.0000 mL | Freq: Three times a day (TID) | ENTERAL | Status: DC

## 2023-12-28 MED ORDER — PROSOURCE TF20 ENFIT COMPATIBL EN LIQD
60.0000 mL | Freq: Four times a day (QID) | ENTERAL | Status: DC
  Administered 2023-12-28 – 2023-12-30 (×9): 60 mL
  Filled 2023-12-28 (×8): qty 60

## 2023-12-28 MED ORDER — GADOBUTROL 1 MMOL/ML IV SOLN
10.0000 mL | Freq: Once | INTRAVENOUS | Status: AC | PRN
  Administered 2023-12-28: 10 mL via INTRAVENOUS

## 2023-12-28 MED ORDER — ADULT MULTIVITAMIN W/MINERALS CH
1.0000 | ORAL_TABLET | Freq: Every day | ORAL | Status: DC
  Administered 2023-12-28 – 2023-12-30 (×3): 1
  Filled 2023-12-28 (×3): qty 1

## 2023-12-28 MED ORDER — PROSOURCE TF20 ENFIT COMPATIBL EN LIQD
60.0000 mL | Freq: Every day | ENTERAL | Status: DC
  Administered 2023-12-28: 60 mL
  Filled 2023-12-28: qty 60

## 2023-12-28 MED ORDER — POTASSIUM CHLORIDE 20 MEQ PO PACK
20.0000 meq | PACK | ORAL | Status: AC
  Administered 2023-12-28 (×2): 20 meq
  Filled 2023-12-28 (×2): qty 1

## 2023-12-28 MED ORDER — SODIUM CHLORIDE 0.9 % IV SOLN
12.0000 g | INTRAVENOUS | Status: DC
  Administered 2023-12-28 – 2023-12-29 (×2): 12 g via INTRAVENOUS
  Filled 2023-12-28 (×3): qty 48

## 2023-12-28 MED ORDER — GLUCERNA 1.5 CAL PO LIQD: 1000.0000 mL | ORAL | Status: DC

## 2023-12-28 MED ORDER — VITAL HIGH PROTEIN PO LIQD
1000.0000 mL | ORAL | Status: DC
Start: 2023-12-28 — End: 2023-12-28

## 2023-12-28 MED ORDER — LACTATED RINGERS IV BOLUS
500.0000 mL | Freq: Once | INTRAVENOUS | Status: AC
  Administered 2023-12-28: 500 mL via INTRAVENOUS

## 2023-12-28 MED ORDER — LACTATED RINGERS IV SOLN: INTRAVENOUS | Status: DC

## 2023-12-28 MED ORDER — FAMOTIDINE 20 MG PO TABS
20.0000 mg | ORAL_TABLET | Freq: Every day | ORAL | Status: DC
  Administered 2023-12-28: 20 mg
  Filled 2023-12-28 (×2): qty 1

## 2023-12-28 MED ORDER — VITAL 1.5 CAL PO LIQD
1000.0000 mL | ORAL | Status: DC
  Administered 2023-12-28: 1000 mL

## 2023-12-28 MED ORDER — SODIUM CHLORIDE 0.9 % IV SOLN: 2.0000 g | Freq: Two times a day (BID) | INTRAVENOUS | Status: DC

## 2023-12-28 MED ORDER — POTASSIUM CHLORIDE 10 MEQ/50ML IV SOLN
10.0000 meq | INTRAVENOUS | Status: AC
  Administered 2023-12-28 (×4): 10 meq via INTRAVENOUS
  Filled 2023-12-28 (×4): qty 50

## 2023-12-28 NOTE — Consult Note (Signed)
 [facility]    Date of Admission:  12/27/2023   Total days of inpatient antibiotics 1        Reason for Consult: mssa bacteremia    Principal Problem:   ICH (intracerebral hemorrhage) (HCC)   Assessment: 73 year old female withdiabetes type 2, hypertension, MS, bedbound at SNFAt baseline, afebrile warfarin presented with rightward gaze, aphasia, right-sided weakness worse than left found to have left parietal small ICH admitted for further management found to have #MSSA bacteremia with hx of recent fall #Left parietal ICH #intubated -Patient has been febrile on arrival, LKW day prior to admission.  CT head showed 2.1 cm parenchymal hematoma with surrounding edema and local mass effect.  MRI brain has been ordered.  Neurology engaged  EEG done which suggested moderate to severe encephalopathy.  -Critical care was consulted noted consider LP after INR reversed. She also had a nontunneled central venous catheter placed on 6/20.   - Blood cultures admission grew 2/2 MSSA.   - CT abdomen pelvis showed scattered atelectasis in the lung notable in lower lobes, suggestions of bronchitis, wall thickening and urinary bladder possible cystitis. -Etiology of bacteremia unclear, she had a recent fall and was seen in ED on 6/5 at [facility]. Her left  arm is a bit swollen. I don't appreciate overt signs of phlebitis.  Recommendations: - Discontinue ampicillin  and vancomycin  - Start nafcillin  - ok to continue ctx  - Repeat blood cultures to ensure clearance - TTE, will need TEE - Replace line, patient has central line. - Standard precautions  Evaluation of this patient requires complex antimicrobial therapy evaluation and counseling + isolation needs for disease transmission risk assessment and mitigation    Microbiology:   Antibiotics:   Cultures: Blood 6/20 2/2 MSSA Urine  Other   HPI: Brittany Huffman is a 73 y.o. female with past medical history of  diabetes type 2, hypertension, MS, bedbound at SNFAt baseline, afebrile warfarin last seen normal was day prior to admission.  In the a.m. staff facility nose sleepy and rightward gaze.  On arrival to the ED CT head showed left parietal small ICH.  She had a temp of 102.  Brought in for code stroke.SABRA  She has noted to have right gaze right worse than left weakness, aphasia.  She is started on broad-spectrum antibiotics for meningitis coverage given fever.  ID out of consulted given blood cultures grew MSSA.   Review of Systems: Review of Systems  All other systems reviewed and are negative.   Past Medical History:  Diagnosis Date   Diabetes mellitus without complication (HCC)    Pt takes insulin .   Hypertension    Multiple sclerosis (HCC)     Social History   Tobacco Use   Smoking status: Never   Smokeless tobacco: Never  Vaping Use   Vaping status: Never Used  Substance Use Topics   Alcohol use: No   Drug use: No    History reviewed. No pertinent family history. Scheduled Meds:   stroke: early stages of recovery book   Does not apply Once   Chlorhexidine  Gluconate Cloth  6 each Topical Daily   Chlorhexidine  Gluconate Cloth  6 each Topical Q0600   docusate  100 mg Per Tube BID   famotidine   20 mg Per Tube BID   mupirocin  ointment  1 Application Nasal BID   mouth rinse  15 mL Mouth Rinse Q2H   polyethylene glycol  17 g Per  Tube Daily   senna-docusate  1 tablet Per Tube BID   Continuous Infusions:  sodium chloride      sodium chloride      acyclovir  825 mg (12/28/23 0709)   ampicillin  (OMNIPEN) IV 300 mL/hr at 12/28/23 0600   cefTRIAXone  (ROCEPHIN )  IV Stopped (12/27/23 2302)   lactated ringers  75 mL/hr at 12/28/23 0600   norepinephrine  (LEVOPHED ) Adult infusion 12 mcg/min (12/28/23 0747)   propofol  (DIPRIVAN ) infusion 10 mcg/kg/min (12/28/23 0600)   vancomycin      PRN Meds:.Place/Maintain arterial line **AND** sodium chloride , acetaminophen  **OR** acetaminophen   (TYLENOL ) oral liquid 160 mg/5 mL **OR** acetaminophen , fentaNYL  (SUBLIMAZE ) injection, fentaNYL  (SUBLIMAZE ) injection, mouth rinse No Known Allergies  OBJECTIVE: Blood pressure (!) 122/59, pulse 66, temperature (!) 97.4 F (36.3 C), temperature source Axillary, resp. rate (!) 22, height 5' 10 (1.778 m), weight 104 kg, SpO2 100%.  Physical Exam Constitutional:      Comments: intubated  HENT:     Head: Normocephalic and atraumatic.     Right Ear: Tympanic membrane normal.     Left Ear: Tympanic membrane normal.     Nose: Nose normal.     Mouth/Throat:     Mouth: Mucous membranes are moist.   Eyes:     Extraocular Movements: Extraocular movements intact.     Conjunctiva/sclera: Conjunctivae normal.     Pupils: Pupils are equal, round, and reactive to light.    Cardiovascular:     Rate and Rhythm: Normal rate and regular rhythm.     Heart sounds: No murmur heard.    No friction rub. No gallop.  Pulmonary:     Effort: Pulmonary effort is normal.     Breath sounds: Normal breath sounds.  Abdominal:     General: Abdomen is flat.     Palpations: Abdomen is soft.   Skin:    General: Skin is warm and dry.     Lab Results Lab Results  Component Value Date   WBC 18.9 (H) 12/28/2023   HGB 10.0 (L) 12/28/2023   HCT 30.1 (L) 12/28/2023   MCV 85.5 12/28/2023   PLT 109 (L) 12/28/2023    Lab Results  Component Value Date   CREATININE 1.34 (H) 12/28/2023   BUN 40 (H) 12/28/2023   NA 126 (L) 12/28/2023   K 3.2 (L) 12/28/2023   CL 97 (L) 12/28/2023   CO2 16 (L) 12/28/2023    Lab Results  Component Value Date   ALT 27 12/27/2023   AST 66 (H) 12/27/2023   ALKPHOS 53 12/27/2023   BILITOT 2.8 (H) 12/27/2023       Loney Stank, MD [facility] 12/28/2023, 8:28 AM

## 2023-12-28 NOTE — Consult Note (Addendum)
 NAME:  Brittany Huffman, MRN:  [id number], DOB:  01/04/1951, LOS: 1 ADMISSION DATE:  12/27/2023, CONSULTATION DATE:  12/26/2021 REFERRING MD:  GORMAN Mari MD, CHIEF COMPLAINT:  AMS, Sepsis   History of Present Illness:   73 year old nursing home resident with history of MS, CVA, hypertension, diabetes, hyperlipidemia a fib on coumadin presenting with altered mental status, sepsis, new IPH and stroke.  Elevated lactic acid, INR noted.  Given IV fluids, Kcentra , vitamin K  in the ED. PCCM called for for help with management.   Pertinent  Medical History    has a past medical history of Diabetes mellitus without complication (HCC), Hypertension, and Multiple sclerosis (HCC).   Significant Hospital Events: Including procedures, antibiotic start and stop dates in addition to other pertinent events   6/20 Admitted to ICU, intubated, CVL/A-line placed  Interim History / Subjective:   No acute events overnight Remains off sedation  Objective    Blood pressure (!) 119/58, pulse 66, temperature 98.2 F (36.8 C), temperature source Oral, resp. rate (!) 27, height 5' 10 (1.778 m), weight 104 kg, SpO2 99%.    Vent Mode: PRVC FiO2 (%):  [40 %-100 %] 40 % Set Rate:  [16 bmp] 16 bmp Vt Set:  [550 fO-449449 mL] 550550 mL PEEP:  [5 cmH20] 5 cmH20 Plateau Pressure:  [16 cmH20-19 cmH20] 19 cmH20   Intake/Output Summary (Last 24 hours) at 12/28/2023 1111 Last data filed at 12/28/2023 9066 Gross per 24 hour  Intake 5916.01 ml  Output 850 ml  Net 5066.01 ml   Filed Weights   12/27/23 1200  Weight: 104 kg    Examination: Gen:      No acute distress, chronically ill-appearing, obese HEENT:  moist mucous membranes, sclera anicteric Neck:     No masses; no thyromegaly Lungs:    Clear to auscultation bilaterally; normal respiratory effort CV:         Regular rate and rhythm; no murmurs Abd:      + bowel sounds; soft, non-tender; no palpable masses, no distension Ext:    No edema; adequate  peripheral perfusion Neuro: left gaze deviation, pupils dilated and sluggish to light, not following commands, responds to painful simuli  Labs/Imaging personally reviewed, significant for CT head with hematoma measuring 2.1 cm in the left frontal lobe, remote infarct in the right parietal lobe, mild chronic microvascular ischemic changes  Resolved problem list   Assessment and Plan  IPH Reversed Coumadin with kcentra  and vit K, INR 1.3 today Keppra  for seizure prophylaxis Management per neurology  Septic Shock, present on admission due to Staph Aureus Bacteremia, concern for UTI and aspiration pneumonia vs CNS infection Elevated lactic acid Continue ceftriaxone , ampicillin , vancomycin  and acyclovir  Concern for cystitis on CT scan Started empiric meningitis coverage. Will discuss with Neurology, I think clear urinary source at this time. Follow cultures TTE does not show valvular vegetations Continue vasopressor support, MAP goal 65 or greater  Acute hypoxemic respiratory failure Continue mechanical ventilatory support Check tracheal aspirate Mental status is barrier to extubation Check ABG  Concern for Hypertrophic Cardiomyopathy or Infiltrative Cardiomyopathy Based on echo 6/21 Will consider cardiology evaluation  AKI, baseline Cr 0.80 Hypokalemia Monitor urine output and Cr.    Afib, HTN Tele monitoring Check troponin and echo Hold outpatient HTN meds  Diabetes SSI coverage  Best Practice (right click and Reselect all SmartList Selections daily)   Diet/type: tubefeeds DVT prophylaxis SCD Pressure ulcer(s): N/A GI prophylaxis: PPI Lines: Central line and Arterial Line Foley:  Yes,  and it is still needed Code Status:  full code Last date of multidisciplinary goals of care discussion []   Critical care time: 45 minutes   Dorn Chill, MD [facility] Office: [phone]   See Amion for personal pager PCCM on call pager [phone] until 7pm. Please call Elink 7p-7a. [phone]

## 2023-12-28 NOTE — Progress Notes (Signed)
 Pt to MRI, continuous monitoring in place.

## 2023-12-28 NOTE — Progress Notes (Signed)
 BP transiently low while in MRI. Scanner stopped, tubing was leaking, pt was not getting levo. Tubing replaced, BPs rising, continuous monitoring of pt in progress.

## 2023-12-28 NOTE — Progress Notes (Addendum)
 Initial Nutrition Assessment  DOCUMENTATION CODES:   Not applicable  INTERVENTION:   Initiate tube feeding via OGT: Vital 1.5 at 35 ml/h 840 ml per day) Prosource TF20 60 ml QID Provides 1580 kcal, 137 gm protein, 642 ml free water daily  MVI with minerals via tube once daily  NUTRITION DIAGNOSIS:   Inadequate oral intake related to inability to eat as evidenced by NPO status.  GOAL:   Patient will meet greater than or equal to 90% of their needs  MONITOR:   TF tolerance  REASON FOR ASSESSMENT:   Consult Enteral/tube feeding initiation and management  ASSESSMENT:   73 yo female admitted with L parietal small ICH. PMH includes multiple sclerosis, bed bound, HTN, DM.  Patient has septic shock due to staph aureus bacteremia, concern for UTI and aspiration pneumonia vs CNS infection.   Patient is currently intubated on ventilator support MV: 13.2 L/min Temp (24hrs), Avg:99.4 F (37.4 C), Min:97.4 F (36.3 C), Max:102 F (38.9 C)  Propofol : 12.5 ml/hr providing 330 kcal per day.  Received MD Consult for TF initiation and management. OG tube in place.  Labs reviewed. Na 125, K 3.2-->3.5 after replacement CBG: 288  Medications reviewed and include colace, pepcid , novolog , miralax , klor-con , senokot-s, IV KCl, levophed , propofol .   No recent PTA weights available for review.   NUTRITION - FOCUSED PHYSICAL EXAM:  Unable to complete  Diet Order:   Diet Order             Diet NPO time specified  Diet effective now                   EDUCATION NEEDS:   No education needs have been identified at this time  Skin:  Skin Assessment: Reviewed RN Assessment  Last BM:  PTA  Height:   Ht Readings from Last 1 Encounters:  12/27/23 5' 10 (1.778 m)    Weight:   Wt Readings from Last 1 Encounters:  12/27/23 104 kg    Ideal Body Weight:  68.2 kg  BMI:  Body mass index is 32.9 kg/m.  Estimated Nutritional Needs:   Kcal:  1600-1800  Protein:   >/= 135 gm  Fluid:  >/= 2 L   Suzen HUNT RD, LDN, CNSC Contact via secure chat. If unavailable, use group chat RD Inpatient.

## 2023-12-28 NOTE — Progress Notes (Signed)
 LTM maint complete - no skin breakdown under:  F3,Pz

## 2023-12-28 NOTE — Progress Notes (Signed)
*  PRELIMINARY RESULTS* Echocardiogram 2D Echocardiogram has been performed.  Fareed Fung D Alric Geise 12/28/2023, 9:15 AM

## 2023-12-28 NOTE — Plan of Care (Incomplete)
 General - Well nourished, well developed, intubated just off sedation.  Ophthalmologic - fundi not visualized due to noncooperation.  Cardiovascular - irregularly irregular heart rate and rhythm.  Neuro - intubated just off sedation, eyes closed, not following commands. With forced eye opening, eyes in mid position, not blinking to visual threat, doll's eyes absent, not tracking. Left pupil 32mm->3mm, R pupil 2.5->61mm. Corneal reflex absent, gag and cough present. Breathing over the vent.  Facial symmetry not able to test due to ET tube.  Tongue protrusion not cooperative. On pain stimulation, mildly withdraw BLEs symmetrically, not movement of BUEs. Sensation, coordination and gait not tested.

## 2023-12-28 NOTE — Progress Notes (Signed)
 OT Cancellation Note  Patient Details Name: Brittany Huffman MRN: [id number] DOB: Oct 25, 1950   Cancelled Treatment:    Reason Eval/Treat Not Completed: Patient not medically ready;Active bedrest order (Pt sedated/intubated. Will assess when medically appropriate.)  [name],HILLARY 12/28/2023, 6:49 AM Kreg Sink, OT/L   Acute OT Clinical Specialist Acute Rehabilitation Services Pager [phone] Office [phone]

## 2023-12-28 NOTE — Progress Notes (Signed)
 Secure chat with Dr Kara re PICC order, blood cx positive and pt currently with CVC.  New order to dc the PICC order.

## 2023-12-28 NOTE — Progress Notes (Signed)
 RT assisted with patient transport on vent to MRI and returned to 4N28 without complications.

## 2023-12-28 NOTE — Progress Notes (Signed)
 Pharmacy Electrolyte Replacement  Recent Labs:  Recent Labs    12/28/23 0552  K 3.2*  MG 1.9  PHOS 3.1  CREATININE 1.34*    Low Critical Values (K </= 2.5, Phos </= 1, Mg </= 1) Present: None  MD Contacted: n/a  Plan: 40 mEq IV KCL (4 runs of 10 mEq) and 40 mEq per tube  Rankin Sams, PharmD, BCPS, BCCCP Clinical Pharmacist

## 2023-12-28 NOTE — Progress Notes (Signed)
 PHARMACY - PHYSICIAN COMMUNICATION CRITICAL VALUE ALERT - BLOOD CULTURE IDENTIFICATION (BCID)  Brittany Huffman is an 72 y.o. female who presented to [facility] on 12/27/2023 as a code stroke activation.  Assessment:  Admitted for ICH and concern for meningitis, started on empiric coverage; blood cx growing MSSA in 4 of 4 bottles.  Name of physician (or Provider) Contacted: KMohan MD (CCM) and SBhagat MD (neuro)  Current antibiotics: ampicillin , ceftriaxone , vancomycin  (also acyclovir )  Changes to prescribed antibiotics recommended:  Will continue broad-spectrum ABX for now at this point of w/u; if other sources of infection are ruled out can consider narrowing to cefazolin.  Results for orders placed or performed during the hospital encounter of 12/27/23  Blood Culture ID Panel (Reflexed) (Collected: 12/27/2023  1:17 PM)  Result Value Ref Range   Enterococcus faecalis NOT DETECTED NOT DETECTED   Enterococcus Faecium NOT DETECTED NOT DETECTED   Listeria monocytogenes NOT DETECTED NOT DETECTED   Staphylococcus species DETECTED (A) NOT DETECTED   Staphylococcus aureus (BCID) DETECTED (A) NOT DETECTED   Staphylococcus epidermidis NOT DETECTED NOT DETECTED   Staphylococcus lugdunensis NOT DETECTED NOT DETECTED   Streptococcus species NOT DETECTED NOT DETECTED   Streptococcus agalactiae NOT DETECTED NOT DETECTED   Streptococcus pneumoniae NOT DETECTED NOT DETECTED   Streptococcus pyogenes NOT DETECTED NOT DETECTED   A.calcoaceticus-baumannii NOT DETECTED NOT DETECTED   Bacteroides fragilis NOT DETECTED NOT DETECTED   Enterobacterales NOT DETECTED NOT DETECTED   Enterobacter cloacae complex NOT DETECTED NOT DETECTED   Escherichia coli NOT DETECTED NOT DETECTED   Klebsiella aerogenes NOT DETECTED NOT DETECTED   Klebsiella oxytoca NOT DETECTED NOT DETECTED   Klebsiella pneumoniae NOT DETECTED NOT DETECTED   Proteus species NOT DETECTED NOT DETECTED   Salmonella species NOT DETECTED NOT  DETECTED   Serratia marcescens NOT DETECTED NOT DETECTED   Haemophilus influenzae NOT DETECTED NOT DETECTED   Neisseria meningitidis NOT DETECTED NOT DETECTED   Pseudomonas aeruginosa NOT DETECTED NOT DETECTED   Stenotrophomonas maltophilia NOT DETECTED NOT DETECTED   Candida albicans NOT DETECTED NOT DETECTED   Candida auris NOT DETECTED NOT DETECTED   Candida glabrata NOT DETECTED NOT DETECTED   Candida krusei NOT DETECTED NOT DETECTED   Candida parapsilosis NOT DETECTED NOT DETECTED   Candida tropicalis NOT DETECTED NOT DETECTED   Cryptococcus neoformans/gattii NOT DETECTED NOT DETECTED   Meth resistant mecA/C and MREJ NOT DETECTED NOT DETECTED    [name], PharmD, BCPS  12/28/2023  1:41 AM

## 2023-12-28 NOTE — Progress Notes (Signed)
 PT Cancellation Note  Patient Details Name: Brittany Huffman MRN: [id number] DOB: 11-16-1950   Cancelled Treatment:    Reason Eval/Treat Not Completed: Active bedrest order;Other (comment) bedrest order remains this morning that is not set to expire until 14:34 today. Pt also intubated and sedated at this time. PT will continue to follow but hold on initial evaluation until after bedrest expires or until otherwise directed by MD.   Izetta Call, PT, DPT   Acute Rehabilitation Department Office [phone] Secure Chat Communication Preferred    Izetta JULIANNA Call 12/28/2023, 8:16 AM

## 2023-12-28 NOTE — Procedures (Signed)
 Patient Name: Brittany Huffman  MRN: [id number]  Epilepsy Attending: Arlin MALVA Krebs  Referring Physician/Provider: Khaliqdina, Salman, MD  Duration: 12/27/2023 1655 to 12/28/2023 1655  Patient history:  73 y.o. female with hx of DM2, HTN, MS, bedbound and in a SNF at baseline, afibb on warfarin who was last seen normal yesterday by her team. This AM, was noted by staff at her facility to be sleepy and R gaze. CTH showed  intraparenchymal hemorrhage at the left precentral gyrus. EEG to evaluate for seizure.  Level of alertness: comatose/ lethargic   AEDs during EEG study: Propofol   Technical aspects: This EEG study was done with scalp electrodes positioned according to the 10-20 International system of electrode placement. Electrical activity was reviewed with band pass filter of 1-70Hz , sensitivity of 7 uV/mm, display speed of 64mm/sec with a 60Hz  notched filter applied as appropriate. EEG data were recorded continuously and digitally stored.  Video monitoring was available and reviewed as appropriate.  Description: EEG showed continuous generalized 3 to 6 Hz theta-delta slowing. Hyperventilation and photic stimulation were not performed.    Event button was pressed on 12/28/2023 at 1928 for unclear reason. Concomitant eeg before, during and after the event didn't show any eeg changes.    Event button was pressed on 12/28/2023 at 2115 for right arm twitching. Concomitant eeg before, during and after the event didn't show any eeg changes.    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 No definite epileptiform discharges were seen throughout the recording.  Event button was pressed on 12/28/2023 at 1928 for unclear reason. Concomitant eeg before, during and after the event didn't show any eeg changes.   Event button was pressed on 12/28/2023 at 2115 for right arm twitching without concomitant eeg change. However,  semiology of event was concerning for focal motor seizure which may not be seen on scalp eeg.    Nadiyah Zeis O Cecil Vandyke

## 2023-12-28 NOTE — Progress Notes (Signed)
 UOP 260 in 11 hrs. MD notified, orders received. Will cont to monitor I&Os closely.

## 2023-12-28 NOTE — Progress Notes (Addendum)
 STROKE TEAM PROGRESS NOTE    SIGNIFICANT HOSPITAL EVENTS  6/20: Presented from SNF d/t R gaze, sleepiness. 102 fever, elevated lactate, positive UA.   Last home coumadin dose 1700 6/19 CT Head with small L parietal ICH. CTA with no spot sign.   Blood cultures positive for MSSA bacteremia  INTERIM HISTORY/SUBJECTIVE  This morning's LTM shows evidence of severe diffuse encephalopathy, no seizures.   Intubated.      Propofol --off for 2 hours before neuro exam.  Does not open eyes, does not follow commands not blinking to threat, doll's eyes absent, not tracking.  Absent corneal reflex, present cough and gag reflex.  She is breathing over the vent.  Withdraws BLE.  Patient has multiple reasons for decreased mental status on top of stroke--Hx of MS, Infection, Toxi-Metabolic enceph from electrolyte imbalances, hypotension with need for pressor support  Will continue to monitor fever and WBC for possibility of LP need. Due to bleed, she is not a good candidate at this time.  Per daughter, facility had told her she had a stomach virus this past week (on Wednesday and Thursday) and was having fatigue and diarrhea.   MRI: Numerous infratentorial and supratentorial acute infarcts. These appear to be embolic in etiology. Presentation of symptoms, labs, imaging warrants TEE to evaluate for endocarditis.   OBJECTIVE  CBC    Component Value Date/Time   WBC 18.9 (H) 12/28/2023 0552   RBC 3.52 (L) 12/28/2023 0552   HGB 10.0 (L) 12/28/2023 0552   HCT 30.1 (L) 12/28/2023 0552   PLT 109 (L) 12/28/2023 0552   MCV 85.5 12/28/2023 0552   MCH 28.4 12/28/2023 0552   MCHC 33.2 12/28/2023 0552   RDW 15.6 (H) 12/28/2023 0552   LYMPHSABS 0.1 (L) 12/27/2023 1254   MONOABS 0.7 12/27/2023 1254   EOSABS 0.0 12/27/2023 1254   BASOSABS 0.0 12/27/2023 1254    BMET    Component Value Date/Time   NA 126 (L) 12/28/2023 0552   K 3.2 (L) 12/28/2023 0552   CL 97 (L) 12/28/2023 0552   CO2 16 (L) 12/28/2023  0552   GLUCOSE 251 (H) 12/28/2023 0552   BUN 40 (H) 12/28/2023 0552   CREATININE 1.34 (H) 12/28/2023 0552   CALCIUM  7.8 (L) 12/28/2023 0552   GFRNONAA 42 (L) 12/28/2023 0552    IMAGING past 24 hours Overnight EEG with video Result Date: 12/28/2023 Shelton Arlin KIDD, MD     12/28/2023  7:08 AM Patient Name: Brittany Huffman MRN: [id number] Epilepsy Attending: Arlin KIDD Shelton Referring Physician/Provider: Khaliqdina, Salman, MD Duration: 12/27/2023 1655 to 12/28/2023 0700 Patient history:  73 y.o. female with hx of DM2, HTN, MS, bedbound and in a SNF at baseline, afibb on warfarin who was last seen normal yesterday by her team. This AM, was noted by staff at her facility to be sleepy and R gaze. CTH showed  intraparenchymal hemorrhage at the left precentral gyrus. EEG to evaluate for seizure. Level of alertness: comatose/ lethargic AEDs during EEG study: Propofol  Technical aspects: This EEG study was done with scalp electrodes positioned according to the 10-20 International system of electrode placement. Electrical activity was reviewed with band pass filter of 1-70Hz , sensitivity of 7 uV/mm, display speed of 49mm/sec with a 60Hz  notched filter applied as appropriate. EEG data were recorded continuously and digitally stored.  Video monitoring was available and reviewed as appropriate. Description: EEG showed continuous generalized 3 to 6 Hz theta-delta slowing. Hyperventilation and photic stimulation were not performed.    ABNORMALITY - Continuous  slow, generalized  IMPRESSION: This study is  suggestive of moderate to severe diffuse encephalopathy. No seizures or epileptiform discharges were seen throughout the recording.  Arlin MALVA Krebs    DG CHEST PORT 1 VIEW Result Date: 12/27/2023 CLINICAL DATA:  Central line placement EXAM: PORTABLE CHEST 1 VIEW COMPARISON:  12/27/2023 FINDINGS: Endotracheal tube tip 3.9 cm above the carina, satisfactorily positioned. A left IJ central line is noted with tip at the  junction of the brachiocephalic vein and the SVC with tip oriented rightwards but not definitively abutting the wall of the SVC. No pneumothorax. Nasogastric tube enters the stomach. Atherosclerotic calcification of the aortic arch. Mild enlargement of the cardiopericardial silhouette, without edema. No blunting of the costophrenic angles. IMPRESSION: 1. Left IJ central line tip at the junction of the brachiocephalic vein and the SVC with tip oriented rightwards but not definitively abutting the wall of the SVC. No pneumothorax. 2. Mild enlargement of the cardiopericardial silhouette, without edema. 3. Aortic Atherosclerosis (ICD10-I70.0). Electronically Signed   By: Ryan Salvage M.D.   On: 12/27/2023 21:55   CT HEAD POST STROKE FOLLOWUP/TIMED/STAT READ Result Date: 12/27/2023 CLINICAL DATA:  Code stroke.  Hemorrhage follow-up EXAM: CT HEAD WITHOUT CONTRAST TECHNIQUE: Contiguous axial images were obtained from the base of the skull through the vertex without intravenous contrast. RADIATION DOSE REDUCTION: This exam was performed according to the departmental dose-optimization program which includes automated exposure control, adjustment of the mA and/or kV according to patient size and/or use of iterative reconstruction technique. COMPARISON:  None Available. FINDINGS: Brain: Unchanged intraparenchymal hemorrhage at the left precentral gyrus. Old right parietal infarct. Mild generalized volume loss. No hydrocephalus or new mass effect. Vascular: No hyperdense vessel or unexpected vascular calcification. Skull: The visualized skull base, calvarium and extracranial soft tissues are normal. Sinuses/Orbits: No fluid levels or advanced mucosal thickening of the visualized paranasal sinuses. No mastoid or middle ear effusion. Normal orbits. Other: None. IMPRESSION: 1. Unchanged intraparenchymal hemorrhage at the left precentral gyrus. 2. Old right parietal infarct. Electronically Signed   By: Franky Stanford M.D.    On: 12/27/2023 19:56   DG Abd 1 View Result Date: 12/27/2023 CLINICAL DATA:  Orogastric tube placement EXAM: ABDOMEN - 1 VIEW COMPARISON:  CT scan 12/27/2023 FINDINGS: Compared to the CT scan, the orogastric tube appears to have been advanced, with tip and side port now in the stomach body, satisfactorily positioned. Bandlike bibasilar atelectasis.  Atherosclerosis noted. IMPRESSION: 1. Orogastric tube tip and side port are now in the stomach body, satisfactorily positioned. 2. Bandlike bibasilar atelectasis. Electronically Signed   By: Ryan Salvage M.D.   On: 12/27/2023 18:41   CT CHEST ABDOMEN PELVIS WO CONTRAST Result Date: 12/27/2023 CLINICAL DATA:  Sepsis EXAM: CT CHEST, ABDOMEN AND PELVIS WITHOUT CONTRAST TECHNIQUE: Multidetector CT imaging of the chest, abdomen and pelvis was performed following the standard protocol without IV contrast. RADIATION DOSE REDUCTION: This exam was performed according to the departmental dose-optimization program which includes automated exposure control, adjustment of the mA and/or kV according to patient size and/or use of iterative reconstruction technique. COMPARISON:  CT abdomen 08/18/2021 FINDINGS: CT CHEST FINDINGS Cardiovascular: Coronary, aortic arch, and branch vessel atherosclerotic vascular disease. Moderate cardiomegaly. Very heavy annular calcification of the mitral valve. Mild aortic valve calcification. Mediastinum/Nodes: Nasogastric tube tip is in the distal esophagus. If gastric placement is desired, advance 10 cm. Endotracheal tube satisfactorily positioned with tip 3.7 cm above the carina. Lungs/Pleura: Scattered atelectasis in the lungs most notable in the lower  lobes. There is some mild mosaic attenuation in the right upper lobe possibly from air trapping. Airway thickening is present, suggesting bronchitis or reactive airways disease. Musculoskeletal: Unremarkable CT ABDOMEN PELVIS FINDINGS Hepatobiliary: Mildly distended gallbladder with  multiple dependent gallstones. No significant biliary dilatation. Pancreas: Moderate pancreatic atrophy. Spleen: Unremarkable Adrenals/Urinary Tract: Fullness of the adrenal glands without discrete mass. Contrast medium in the renal collecting systems presumably left over from the patient's CT angiogram of the head and neck. Wall thickening in the urinary bladder, correlate with urine analysis to exclude cystitis. There is some borderline wall thickening in both ureters, cannot exclude mild ureteritis. Stomach/Bowel: Unremarkable Vascular/Lymphatic: Dense atheromatous vascular calcification of the abdominal aorta and its branches including heavy atheromatous plaque in the superior mesenteric artery plaque in the proximal celiac trunk. Patency not assessed on this noncontrast examination. Reproductive: Unremarkable Other: Trace ascites primarily in the paracolic gutters. Musculoskeletal: 1.1 cm anterolisthesis at L5-S1 with elimination of the intervertebral disc space and possible interbody fusion. Cannot exclude a left L5 pars defect. Loss of disc height and mild grade 1 degenerative retrolisthesis at L2-3. Degenerative hip arthropathy bilaterally. IMPRESSION: 1. Nasogastric tube tip is in the distal esophagus. If gastric placement is desired, advance 10 cm. 2. Scattered atelectasis in the lungs most notable in the lower lobes. There is some mild mosaic attenuation in the right upper lobe possibly from air trapping. 3. Airway thickening is present, suggesting bronchitis or reactive airways disease. 4. Cholelithiasis. 5. Wall thickening in the urinary bladder, correlate with urine analysis to exclude cystitis. There is some borderline wall thickening in both ureters, cannot exclude mild ureteritis. 6. Trace ascites primarily in the paracolic gutters. 7. Moderate cardiomegaly. 8. 1.1 cm anterolisthesis at L5-S1 with possible interbody fusion. Cannot exclude a left L5 pars defect. Loss of disc height and mild grade 1  degenerative retrolisthesis at L2-3. 9. Degenerative hip arthropathy bilaterally. 10.  Aortic Atherosclerosis (ICD10-I70.0). Electronically Signed   By: Ryan Salvage M.D.   On: 12/27/2023 18:41   US  EKG SITE RITE Result Date: 12/27/2023 If Site Rite image not attached, placement could not be confirmed due to current cardiac rhythm.  DG CHEST PORT 1 VIEW Result Date: 12/27/2023 CLINICAL DATA:  Acute respiratory failure.  Code stroke. EXAM: PORTABLE CHEST 1 VIEW COMPARISON:  Earlier today. FINDINGS: Interval endotracheal tube in satisfactory position. Interval nasogastric tube with its tip in the distal esophagus. It is recommended that this be advanced at least 15 cm. Stable enlarged cardiac silhouette and clear lungs. Unremarkable bones. IMPRESSION: 1. Endotracheal tube in satisfactory position. 2. Nasogastric tube tip in the distal esophagus. It is recommended that this be advanced at least 15 cm. 3. Stable cardiomegaly. Electronically Signed   By: Elspeth Bathe M.D.   On: 12/27/2023 16:41   DG Chest Port 1 View Result Date: 12/27/2023 CLINICAL DATA:  Fever. EXAM: PORTABLE CHEST 1 VIEW COMPARISON:  None. FINDINGS: Enlarged cardiac silhouette. Clear lungs with normal vascularity. Unremarkable bones. IMPRESSION: Cardiomegaly.  No acute abnormality. Electronically Signed   By: Elspeth Bathe M.D.   On: 12/27/2023 16:39   CT ANGIO HEAD NECK W WO CM (CODE STROKE) Result Date: 12/27/2023 CLINICAL DATA:  Neuro deficit, concern for stroke, parenchymal hemorrhage. EXAM: CT ANGIOGRAPHY HEAD AND NECK WITH AND WITHOUT CONTRAST TECHNIQUE: Multidetector CT imaging of the head and neck was performed using the standard protocol during bolus administration of intravenous contrast. Multiplanar CT image reconstructions and MIPs were obtained to evaluate the vascular anatomy. Carotid stenosis measurements (when  applicable) are obtained utilizing NASCET criteria, using the distal internal carotid diameter as the  denominator. RADIATION DOSE REDUCTION: This exam was performed according to the departmental dose-optimization program which includes automated exposure control, adjustment of the mA and/or kV according to patient size and/or use of iterative reconstruction technique. CONTRAST:  75mL OMNIPAQUE  IOHEXOL  350 MG/ML SOLN COMPARISON:  Same-day head CT.  Carotid ultrasound 05/11/2017. FINDINGS: CTA NECK FINDINGS Aortic arch: Standard configuration of the aortic arch. Imaged portion shows no evidence of aneurysm or dissection. Mild atherosclerosis of the visualized aortic arch. No significant stenosis of the major arch vessel origins. Pulmonary arteries: As permitted by contrast timing, there are no filling defects in the visualized pulmonary arteries. Subclavian arteries: Patent bilaterally. Atherosclerosis along the proximal aspect of both subclavian arteries without significant stenosis. Right carotid system: Patent from the origin to the skull base. Motion artifact slightly limits evaluation of the vessel origin. Calcified and noncalcified atherosclerosis at the carotid bifurcation resulting in approximately 40% stenosis at the origin of the cervical ICA. Additional bulky calcified atherosclerosis along the proximal cervical ICA without significant stenosis. Left carotid system: Patent from the origin to the skull base. Mild atherosclerosis at the vessel origin without stenosis. Additional calcified atherosclerosis at the carotid bifurcation resulting in approximately 40% stenosis at the origin of the cervical ICA. Tortuosity of the distal cervical ICA. Vertebral arteries: Codominant. Patent from the origins to the vertebrobasilar confluence. Atherosclerosis at the left vertebral artery origin resulting in severe stenosis. No significant stenosis at the origin of the right vertebral artery. No dissection. Skeleton: No acute findings. Degenerative changes in the cervical spine. Significant disc space narrowing at C4-5  through C6-7. Edentulous maxilla and mandible. Other neck: The visualized airway is patent. No cervical lymphadenopathy. Multiple thyroid nodules including a calcified left thyroid nodule measuring up to 2.1 cm. Upper chest: Visualized lung apices are clear. Review of the MIP images confirms the above findings CTA HEAD FINDINGS ANTERIOR CIRCULATION: The intracranial ICAs are patent bilaterally. Atherosclerosis throughout the carotid siphons. Mild-to-moderate stenosis of the bilateral paraclinoid ICAs. No severe stenosis, proximal occlusion, aneurysm, or vascular malformation. MCAs: Patent bilaterally. There is diminutive caliber of right MCA branches within the posterior aspect of the right MCA territory likely related to prior infarct. Irregularity and mild narrowing of the left M1 segment. There is irregularity and moderate multifocal narrowing of a proximal M2 branch of the left MCA. ACAs: The anterior cerebral arteries are patent bilaterally. POSTERIOR CIRCULATION: No significant stenosis, proximal occlusion, aneurysm, or vascular malformation. PCAs: The posterior cerebral arteries are patent bilaterally. Pcomm: Visualized on the right. SCAs: The superior cerebellar arteries are patent bilaterally. Basilar artery: Patent. Mild narrowing of the distal basilar artery. AICAs: Patent PICAs: Patent Vertebral arteries: The intracranial vertebral arteries are patent. Venous sinuses: As permitted by contrast timing, patent. Anatomic variants: None Review of the MIP images confirms the above findings IMPRESSION: No large vessel occlusion. Atherosclerosis at the carotid bifurcations resulting in approximately 40% stenosis at the origins of both cervical ICAs. Severe stenosis at the origin of the left vertebral artery. Atherosclerosis of the carotid siphons resulting in mild-to-moderate stenosis. Irregularity and mild stenosis of the left M1 segment. Additional moderate multifocal stenosis of a proximal M2 branch of the  left MCA. Multiple thyroid nodules including a 2.1 cm calcified nodule. Recommend correlation with nonemergent thyroid ultrasound if not previously performed. Aortic Atherosclerosis (ICD10-I70.0). Electronically Signed   By: Donnice Mania M.D.   On: 12/27/2023 13:39   CT HEAD CODE  STROKE WO CONTRAST Result Date: 12/27/2023 CLINICAL DATA:  Code stroke.  Neuro deficit, concern for stroke. EXAM: CT HEAD WITHOUT CONTRAST TECHNIQUE: Contiguous axial images were obtained from the base of the skull through the vertex without intravenous contrast. RADIATION DOSE REDUCTION: This exam was performed according to the departmental dose-optimization program which includes automated exposure control, adjustment of the mA and/or kV according to patient size and/or use of iterative reconstruction technique. COMPARISON:  MRI head 04/01/2023, CT head 12/12/2023. FINDINGS: Brain: 2.1 x 1.4 x 1.6 cm parenchymal hematoma in the posterior left frontal lobe likely involving the precentral gyrus with surrounding edema and mild local mass effect. No midline shift. Redemonstrated encephalomalacia in the right parietal lobe extending into the posterior aspect of the right superior temporal gyrus compatible with remote infarct. Nonspecific hypoattenuation in the periventricular and subcortical white matter favored to reflect chronic microvascular ischemic changes. Generalized parenchymal volume loss. The basilar cisterns are patent. Ventricles: Prominence of the ventricles suggesting underlying parenchymal volume loss. Vascular: Atherosclerotic calcifications of the carotid siphons. No hyperdense vessel. Skull: No acute or aggressive finding. Orbits: Right lens replacement.  Orbits otherwise unremarkable. Sinuses: The visualized paranasal sinuses are clear. Other: Mastoid air cells are clear. ASPECTS ([place]ber[place] Stroke Program Early CT Score) - Ganglionic level infarction (caudate, lentiform nuclei, internal capsule, insula, M1-M3 cortex): 7 -  Supraganglionic infarction (M4-M6 cortex): 3 Total score (0-10 with 10 being normal): 10 IMPRESSION: 1. 2.1 cm parenchymal hematoma in the posterior left frontal lobe with mild surrounding edema and local mass effect. No midline shift. An underlying hemorrhagic lesion cannot be excluded although this is considered less likely given the normal appearance of this region on MRI from September 2024. Consider repeat MRI head with and without contrast after resolution of acute episode. 2. Remote infarct in the right parietal temporal lobes. 3. Mild chronic microvascular ischemic changes and generalized parenchymal volume loss. 4. ASPECTS is 10 Finding of left frontal hematoma communicated to Dr. Vanessa at 1:15 pm on 12/27/2023 by text page via the [facility] messaging system. Electronically Signed   By: Donnice Mania M.D.   On: 12/27/2023 13:15    Vitals:   12/28/23 0630 12/28/23 0645 12/28/23 0700 12/28/23 0718  BP:   (!) 122/59   Pulse: 66 66 66 66  Resp: 20 20 (!) 21 (!) 22  Temp:    (!) 97.4 F (36.3 C)  TempSrc:    Axillary  SpO2: 100% 100% 100% 100%  Weight:      Height:        PHYSICAL EXAM General: chronically and acutely ill  CV: Irregularly irregular heart rate and rhythm on monitor Respiratory:  Intubated, mechanically ventilated on full support  NEURO:  Intubated just off sedation.  Patient's eyes are closed, she does not open to voice or noxious stimuli.  She does not follow commands.  Eyes are midline with bilateral pupils reactive.  Left pupil is larger than right pupil. Left gaze preference. Absent corneal reflex, present cough and gag reflex.  She is breathing over the vent. Unable to test facial symmetry due to ETT tube present. Mildly withdraws BLE symmetrically.  No movement of BUE seen spontaneous or to command or in response to noxious stimuli. Sensation coordination and gait not tested  Most Recent NIH 21.    ASSESSMENT/PLAN  Brittany Huffman is a 73 y.o. female with  hx of DM2, HTN, MS, bedbound in a SNF at baseline, Afib on warfarin who was BIB EMS after being found by  staff at her facility to be sleepy with R gaze. Per notes, patient was found in the am but EMS was ot called until the afternoon.  On arrival, aphasic, weak all over but more so on the right than left and gaze deviation to the right. CT Head with L parietal small ICH.Labs with INR of 4, elevated lactate. Facility [facility] reviewed and shows that patient was given 3mg  of warfarin at 1700 6/20.  NIH on Admission 26.  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  No midline shift. Remote infarct right parietal temporal lobes, Aspects 10 CTA head & neck - No LVO, approximate 40% stenosis at origins of both cervical ICAs. Severe stenosis origin of left vertebral artery. Irregularity and mild stenosis left M1.  Moderate multifocal stenosis proximal left M2 MRI w/wo Numerous infratentorial and supratentorial acute infarcts. Numerous punctate foci of susceptibility artifacts which may represent micro emboli versus chronic microhemorrhages. ?amyloid angiopathy 2D Echo: EF greater than 75%, severe concentric LVH, severely dilated left atria Recommend TEE for further evaluation for endocarditis.  LTM EEG 12/27/2023 1655 to 12/28/2023 0700  suggestive of moderate to severe diffuse encephalopathy. No seizures or epileptiform discharges were seen throughout the recording.  will leave on for 24 hours, while weaning off sedation.  LDL PENDING TG 254 A1c 7.6 VTE prophylaxis - SCDs warfarin daily prior to admission, now on No antithrombotic due to ICH Therapy recommendations:  Pending Disposition: Pending  Acute respiratory failure ?Aspiration Pneumonia CCM management, appreciate assistance Mental status is currently barrier to  extubation Wean sedation as able  Sepsis, Septic Shock MSSA bacteremia UTI  ? Endocarditis ? Meningitis  Tmax 102--100.5-afebrile Leukocytosis WBC 12.4--18.9 LA: 5.2-5.4-7.6-3.1 UA WBC > 50, likely source MSSA present on B Cx Not a candidate for LP due to IPH No vegetation on TTE, recommend TEE Requiring pressor support, goal MAP >65 ID on board, was on broad antibiotics -> nafcillin  and Rocephin   Atrial fibrillation Home Meds: Warfarin Reversed with Kcentra  INR 4.0->1.6->1.3 Continue telemetry monitoring Hold anticoagulation due to ICH  Hx of Hypertension Now with Hypotension Unstable Now requiring use of pressor support - levophed  BP goal < 160  Hyperlipidemia Home meds:  none TG 254 LDL PENDING, goal < 70  Diabetes type II Uncontrolled Home meds: Humalog  HgbA1c 7.6, goal < 7.0 CBGs SSI Recommend close follow-up with PCP for better DM control  Dysphagia Intubated Continue tube feeds, Vital 1.5 @ 35 ml/hr  Other Stroke Risk Factors Obesity, Body mass index is 32.9 kg/m., BMI >/= 30 associated with increased stroke risk, recommend weight loss, diet and exercise as appropriate  Advanced age  Other Active Problems Hx of MS - bedbound at SNF Hyponatremia, chronic, Na 126--127 AKI creatinine 1.24--1.34 Elevated LFTs, AST/ALT 66/27 - pending  Hospital day # 1   Pt seen by Neuro NP/APP and later by MD. Note/plan to be edited by MD as needed.    Rocky JAYSON Likes, DNP, AGACNP-BC [facility] Please use AMION for contact information & EPIC for messaging.  ATTENDING NOTE: I reviewed above note and agree with the assessment and plan. Pt was seen and examined.   RN is at the bedside. Tele showed irregularly irregular heart rate and rhythm, pt is intubated just off sedation, eyes closed, not following commands. With forced eye opening, eyes in mid position, not blinking to visual threat,  doll's eyes absent, not tracking. Left pupil 7mm->3mm, R pupil  2.5->44mm. Corneal reflex absent, gag and cough present. Breathing over the vent.  Facial symmetry not able to test due to ET tube.  Tongue protrusion not cooperative. On pain stimulation, mildly withdraw BLEs symmetrically, not movement of BUEs. Sensation, coordination and gait not tested.  For detailed assessment and plan, please refer to above as I have made changes wherever appropriate.   Ary Cummins, MD PhD Stroke Neurology 12/28/2023 6:22 PM  This patient is critically ill due to ICH, endocarditis, embolic strokes, fever, respiratory failure and at significant risk of neurological worsening, death form sepsis, septic shock, mycotic aneurysm rupture, stroke progression, heart failure. This patient's care requires constant monitoring of vital signs, hemodynamics, respiratory and cardiac monitoring, review of multiple databases, neurological assessment, discussion with family, other specialists and medical decision making of high complexity. I spent 40 minutes of neurocritical care time in the care of this patient.  I discussed with Dr. Kara CCM.    To contact Stroke Continuity provider, please refer to [email or web address]. After hours, contact General Neurology

## 2023-12-29 ENCOUNTER — Other Ambulatory Visit: Payer: Self-pay

## 2023-12-29 ENCOUNTER — Inpatient Hospital Stay (HOSPITAL_COMMUNITY)

## 2023-12-29 DIAGNOSIS — R7881 Bacteremia: Secondary | ICD-10-CM | POA: Diagnosis not present

## 2023-12-29 DIAGNOSIS — J9601 Acute respiratory failure with hypoxia: Secondary | ICD-10-CM | POA: Diagnosis not present

## 2023-12-29 DIAGNOSIS — A419 Sepsis, unspecified organism: Secondary | ICD-10-CM

## 2023-12-29 DIAGNOSIS — A4101 Sepsis due to Methicillin susceptible Staphylococcus aureus: Secondary | ICD-10-CM | POA: Diagnosis not present

## 2023-12-29 DIAGNOSIS — R29737 NIHSS score 37: Secondary | ICD-10-CM | POA: Diagnosis not present

## 2023-12-29 DIAGNOSIS — E8721 Acute metabolic acidosis: Secondary | ICD-10-CM

## 2023-12-29 DIAGNOSIS — R579 Shock, unspecified: Secondary | ICD-10-CM | POA: Diagnosis not present

## 2023-12-29 DIAGNOSIS — I6340 Cerebral infarction due to embolism of unspecified cerebral artery: Secondary | ICD-10-CM | POA: Diagnosis not present

## 2023-12-29 DIAGNOSIS — I6389 Other cerebral infarction: Secondary | ICD-10-CM | POA: Diagnosis not present

## 2023-12-29 DIAGNOSIS — B9561 Methicillin susceptible Staphylococcus aureus infection as the cause of diseases classified elsewhere: Secondary | ICD-10-CM | POA: Diagnosis not present

## 2023-12-29 DIAGNOSIS — R569 Unspecified convulsions: Secondary | ICD-10-CM | POA: Diagnosis not present

## 2023-12-29 DIAGNOSIS — I611 Nontraumatic intracerebral hemorrhage in hemisphere, cortical: Secondary | ICD-10-CM | POA: Diagnosis not present

## 2023-12-29 LAB — BASIC METABOLIC PANEL WITH GFR
Anion gap: 13 (ref 5–15)
Anion gap: 15 (ref 5–15)
BUN: 63 mg/dL — ABNORMAL HIGH (ref 8–23)
BUN: 74 mg/dL — ABNORMAL HIGH (ref 8–23)
CO2: 12 mmol/L — ABNORMAL LOW (ref 22–32)
CO2: 16 mmol/L — ABNORMAL LOW (ref 22–32)
Calcium: 7.5 mg/dL — ABNORMAL LOW (ref 8.9–10.3)
Calcium: 7.8 mg/dL — ABNORMAL LOW (ref 8.9–10.3)
Chloride: 97 mmol/L — ABNORMAL LOW (ref 98–111)
Chloride: 94 mmol/L — ABNORMAL LOW (ref 98–111)
Creatinine, Ser: 2.33 mg/dL — ABNORMAL HIGH (ref 0.44–1.00)
Creatinine, Ser: 2.61 mg/dL — ABNORMAL HIGH (ref 0.44–1.00)
GFR, Estimated: 22 mL/min — ABNORMAL LOW (ref >60)
GFR, Estimated: 19 mL/min — ABNORMAL LOW (ref >60)
Glucose, Bld: 403 mg/dL — ABNORMAL HIGH (ref 70–99)
Glucose, Bld: 342 mg/dL — ABNORMAL HIGH (ref 70–99)
Potassium: 4.4 mmol/L (ref 3.5–5.1)
Potassium: 4 mmol/L (ref 3.5–5.1)
Sodium: 122 mmol/L — ABNORMAL LOW (ref 135–145)
Sodium: 125 mmol/L — ABNORMAL LOW (ref 135–145)

## 2023-12-29 LAB — URINE CULTURE

## 2023-12-29 LAB — CBC
HCT: 31.7 % — ABNORMAL LOW (ref 36.0–46.0)
Hemoglobin: 10.5 g/dL — ABNORMAL LOW (ref 12.0–15.0)
MCH: 28.2 pg (ref 26.0–34.0)
MCHC: 33.1 g/dL (ref 30.0–36.0)
MCV: 85 fL (ref 80.0–100.0)
Platelets: 115 10*3/uL — ABNORMAL LOW (ref 150–400)
RBC: 3.73 MIL/uL — ABNORMAL LOW (ref 3.87–5.11)
RDW: 15.8 % — ABNORMAL HIGH (ref 11.5–15.5)
WBC: 18.1 10*3/uL — ABNORMAL HIGH (ref 4.0–10.5)
nRBC: 0 % (ref 0.0–0.2)

## 2023-12-29 LAB — HEPATIC FUNCTION PANEL
ALT: 28 U/L (ref 0–44)
AST: 47 U/L — ABNORMAL HIGH (ref 15–41)
Albumin: <1.5 g/dL — ABNORMAL LOW (ref 3.5–5.0)
Alkaline Phosphatase: 79 U/L (ref 38–126)
Bilirubin, Direct: 2 mg/dL — ABNORMAL HIGH (ref 0.0–0.2)
Indirect Bilirubin: 1.4 mg/dL — ABNORMAL HIGH (ref 0.3–0.9)
Total Bilirubin: 3.4 mg/dL — ABNORMAL HIGH (ref 0.0–1.2)
Total Protein: 4.9 g/dL — ABNORMAL LOW (ref 6.5–8.1)

## 2023-12-29 LAB — PHOSPHORUS
Phosphorus: 4 mg/dL (ref 2.5–4.6)
Phosphorus: 3.9 mg/dL (ref 2.5–4.6)

## 2023-12-29 LAB — GLUCOSE, CAPILLARY
Glucose-Capillary: 365 mg/dL — ABNORMAL HIGH (ref 70–99)
Glucose-Capillary: 398 mg/dL — ABNORMAL HIGH (ref 70–99)
Glucose-Capillary: 368 mg/dL — ABNORMAL HIGH (ref 70–99)
Glucose-Capillary: 289 mg/dL — ABNORMAL HIGH (ref 70–99)
Glucose-Capillary: 273 mg/dL — ABNORMAL HIGH (ref 70–99)
Glucose-Capillary: 245 mg/dL — ABNORMAL HIGH (ref 70–99)

## 2023-12-29 LAB — MAGNESIUM
Magnesium: 2.3 mg/dL (ref 1.7–2.4)
Magnesium: 2.2 mg/dL (ref 1.7–2.4)

## 2023-12-29 LAB — PROTIME-INR
INR: 1.3 — ABNORMAL HIGH (ref 0.8–1.2)
Prothrombin Time: 16.6 s — ABNORMAL HIGH (ref 11.4–15.2)

## 2023-12-29 LAB — TROPONIN I (HIGH SENSITIVITY): Troponin I (High Sensitivity): 533 ng/L (ref <18)

## 2023-12-29 MED ORDER — FAMOTIDINE 20 MG PO TABS
10.0000 mg | ORAL_TABLET | Freq: Every day | ORAL | Status: DC
  Administered 2023-12-29 – 2023-12-30 (×2): 10 mg
  Filled 2023-12-29 (×2): qty 1

## 2023-12-29 MED ORDER — INSULIN ASPART 100 UNIT/ML IJ SOLN
10.0000 [IU] | Freq: Once | INTRAMUSCULAR | Status: AC
  Administered 2023-12-29: 10 [IU] via SUBCUTANEOUS

## 2023-12-29 MED ORDER — ALBUMIN HUMAN 25 % IV SOLN
25.0000 g | Freq: Four times a day (QID) | INTRAVENOUS | Status: AC
  Administered 2023-12-29 – 2023-12-30 (×4): 25 g via INTRAVENOUS
  Filled 2023-12-29 (×4): qty 100

## 2023-12-29 MED ORDER — INSULIN REGULAR HUMAN 100 UNIT/ML IJ SOLN
10.0000 [IU] | Freq: Once | INTRAMUSCULAR | Status: DC
  Filled 2023-12-29: qty 0.1
  Filled 2023-12-29: qty 3

## 2023-12-29 MED ORDER — INSULIN GLARGINE-YFGN 100 UNIT/ML ~~LOC~~ SOLN
10.0000 [IU] | Freq: Every day | SUBCUTANEOUS | Status: DC
  Administered 2023-12-29 – 2023-12-30 (×2): 10 [IU] via SUBCUTANEOUS
  Filled 2023-12-29 (×3): qty 0.1

## 2023-12-29 MED ORDER — ALBUMIN HUMAN 25 % IV SOLN
25.0000 g | Freq: Once | INTRAVENOUS | Status: AC
  Administered 2023-12-29: 25 g via INTRAVENOUS
  Filled 2023-12-29: qty 100

## 2023-12-29 MED ORDER — STERILE WATER FOR INJECTION IV SOLN
INTRAVENOUS | Status: DC
  Filled 2023-12-29 (×2): qty 1000
  Filled 2023-12-29: qty 150

## 2023-12-29 MED ORDER — INSULIN REGULAR BOLUS VIA INFUSION: 10.0000 [IU] | Freq: Once | INTRAVENOUS | Status: DC

## 2023-12-29 MED ORDER — INSULIN ASPART 100 UNIT/ML IJ SOLN
0.0000 [IU] | INTRAMUSCULAR | Status: DC
  Administered 2023-12-29: 11 [IU] via SUBCUTANEOUS
  Administered 2023-12-29: 20 [IU] via SUBCUTANEOUS
  Administered 2023-12-29: 7 [IU] via SUBCUTANEOUS
  Administered 2023-12-29: 11 [IU] via SUBCUTANEOUS
  Administered 2023-12-29: 20 [IU] via SUBCUTANEOUS
  Administered 2023-12-30: 7 [IU] via SUBCUTANEOUS
  Administered 2023-12-30 (×2): 11 [IU] via SUBCUTANEOUS

## 2023-12-29 MED ORDER — HEPARIN SODIUM (PORCINE) 5000 UNIT/ML IJ SOLN
5000.0000 [IU] | Freq: Three times a day (TID) | INTRAMUSCULAR | Status: DC
  Administered 2023-12-29 – 2023-12-30 (×4): 5000 [IU] via SUBCUTANEOUS
  Filled 2023-12-29 (×4): qty 1

## 2023-12-29 MED ORDER — INSULIN ASPART 100 UNIT/ML IJ SOLN
4.0000 [IU] | INTRAMUSCULAR | Status: DC
  Administered 2023-12-29 (×4): 4 [IU] via SUBCUTANEOUS

## 2023-12-29 MED ORDER — SODIUM CHLORIDE 1 G PO TABS
1.0000 g | ORAL_TABLET | Freq: Three times a day (TID) | ORAL | Status: DC
  Administered 2023-12-29 – 2023-12-30 (×5): 1 g
  Filled 2023-12-29 (×5): qty 1

## 2023-12-29 NOTE — Progress Notes (Signed)
 RT note. SBT done this morning, patient flipped back to [facility] due to ^ WOB and RR in 40s'.  RT will continue to monitor.    12/29/23 0743  Vent Select  Invasive or Noninvasive Invasive  Adult Vent Y  Airway 7.5 mm  Placement Date/Time: 12/27/23 1514   Grade View: Grade 1  Placed By: ED Physician  Airway Device: Endotracheal Tube  Laryngoscope Blade: 4  ETT Types: Oral  Size (mm): 7.5 mm  Cuffed: Cuffed  Insertion attempts: 1  Airway Equipment: Stylet;Video Laryn...  Secured at (cm) 23 cm  Measured From Lips  Secured Location Right  Secured By English as a second language teacher No  Tube Holder Repositioned Yes  Prone position No  Cuff Pressure (cm H2O) Clear OR 27-39 CmH2O  Site Condition Dry  Adult Ventilator Settings  Vent Type Servo i  Humidity HME  Vent Mode PRVC  Vt Set 550 mL  Set Rate 16 bmp  FiO2 (%) 40 %  I Time 0.9 Sec(s)  PEEP 5 cmH20  Adult Ventilator Measurements  Peak Airway Pressure 25 L/min  Mean Airway Pressure 15 cmH20  Plateau Pressure 23 cmH20  Resp Rate Spontaneous 12 br/min  Resp Rate Total 28 br/min  Exhaled Vt 724 mL  Measured Ve 16.6 L  I:E Ratio Measured 1:2.0  Auto PEEP 0 cmH20  Total PEEP 5 cmH20  SpO2 100 %  Adult Ventilator Alarms  Alarms On Y  Ve High Alarm 20 L/min  Ve Low Alarm 5 L/min  Resp Rate High Alarm 40 br/min  Resp Rate Low Alarm 10  PEEP Low Alarm 2 cmH2O  Press High Alarm 40 cmH2O  T Apnea 20 sec(s)  Daily Weaning Assessment  Daily Assessment of Readiness to Wean Wean protocol criteria met (SBT performed)  SBT Method CPAP 5 cm H20 and PS 5 cm H20  Weaning Start Time [phone]  Patient response Failed SBT terminated  Reason SBT Terminated (S)  RR > 35 breaths/min or Frequency/TV ratio >105  Breath Sounds  Bilateral Breath Sounds Clear;Diminished  Airway Suctioning/Secretions  Suction Type ETT  Suction Device  Catheter  Secretion Amount None  Suction Tolerance Tolerated well  Suctioning Adverse Effects None

## 2023-12-29 NOTE — Progress Notes (Signed)
LTM EEG disconnected - no skin breakdown at unhook. [facility] notified.  

## 2023-12-29 NOTE — Progress Notes (Signed)
 [facility] consulted, this RN spoke w Lamar Jefferson.   Reference number: [id number] Pt is listed on the donor registry. Will follow up if discussions are had about de-escalating care.   GLENWOOD Brittany Lunger, RN

## 2023-12-29 NOTE — IPAL (Signed)
  Interdisciplinary Goals of Care Family Meeting   Date carried out: 12/29/2023  Location of the meeting: Bedside  Member's involved: Physician, Bedside Registered Nurse, and Family Member or next of kin  Durable Power of Attorney or acting medical decision maker: Ashleigh Key    Discussion: We discussed goals of care for [employer] .  We discussed her critical state of septic shock, multifocal stroke and multiorgan failure. Discussed that resuscitative care would do more harm and it was decided to change code status to DNR. Continue current care and will decide on comfort care in the future.  Code status:   Code Status: Do not attempt resuscitation (DNR) PRE-ARREST INTERVENTIONS DESIRED   Disposition: Continue current acute care  Time spent for the meeting: 20 minutes    Dorn KATHEE Chill, MD  12/29/2023, 12:14 PM

## 2023-12-29 NOTE — Procedures (Signed)
 Patient Name: Brittany Huffman  MRN: [id number]  Epilepsy Attending: Arlin MALVA Krebs  Referring Physician/Provider: Khaliqdina, Salman, MD  Duration: 12/28/2023 1655 to 12/29/2023 9062   Patient history:  73 y.o. female with hx of DM2, HTN, MS, bedbound and in a SNF at baseline, afibb on warfarin who was last seen normal yesterday by her team. This AM, was noted by staff at her facility to be sleepy and R gaze. CTH showed  intraparenchymal hemorrhage at the left precentral gyrus. EEG to evaluate for seizure.   Level of alertness: comatose/ lethargic    AEDs during EEG study: None   Technical aspects: This EEG study was done with scalp electrodes positioned according to the 10-20 International system of electrode placement. Electrical activity was reviewed with band pass filter of 1-70Hz , sensitivity of 7 uV/mm, display speed of 53mm/sec with a 60Hz  notched filter applied as appropriate. EEG data were recorded continuously and digitally stored.  Video monitoring was available and reviewed as appropriate.   Description: EEG showed continuous generalized 3 to 6 Hz theta-delta slowing. Hyperventilation and photic stimulation were not performed.     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 No definite epileptiform discharges were seen throughout the recording.    Emeli Goguen O Brigid Vandekamp

## 2023-12-29 NOTE — Consult Note (Signed)
 NAME:  Brittany Huffman, MRN:  [id number], DOB:  05-25-1951, LOS: 2 ADMISSION DATE:  12/27/2023, CONSULTATION DATE:  12/26/2021 REFERRING MD:  GORMAN Mari MD, CHIEF COMPLAINT:  AMS, Sepsis   History of Present Illness:   73 year old nursing home resident with history of MS, CVA, hypertension, diabetes, hyperlipidemia a fib on coumadin presenting with altered mental status, sepsis, new IPH and stroke.  Elevated lactic acid, INR noted.  Given IV fluids, Kcentra , vitamin K  in the ED. PCCM called for for help with management.   Pertinent  Medical History    has a past medical history of Diabetes mellitus without complication (HCC), Hypertension, and Multiple sclerosis (HCC).   Significant Hospital Events: Including procedures, antibiotic start and stop dates in addition to other pertinent events   6/20 Admitted to ICU, intubated, CVL/A-line placed  Interim History / Subjective:   No acute events overnight Remains off sedation No purposeful movement Updated family at bedside with neurology. Code status changed to DNR/DNI.  Objective    Blood pressure (!) 130/53, pulse 71, temperature 98.4 F (36.9 C), temperature source Axillary, resp. rate (!) 39, height 5' 10 (1.778 m), weight 111.2 kg, SpO2 98%.    Vent Mode: PRVC FiO2 (%):  [40 %] 40 % Set Rate:  [16 bmp] 16 bmp Vt Set:  [550 fO-449449 mL] 550 mL PEEP:  [5 cmH20] 5 cmH20 Plateau Pressure:  [17 cmH20-25 cmH20] 21 cmH20   Intake/Output Summary (Last 24 hours) at 12/29/2023 0735 Last data filed at 12/29/2023 0600 Gross per 24 hour  Intake 2342.98 ml  Output 335 ml  Net 2007.98 ml   Filed Weights   12/27/23 1200 12/29/23 0500  Weight: 104 kg 111.2 kg    Examination: Gen:      No acute distress, chronically ill-appearing, obese HEENT:  moist mucous membranes, sclera anicteric Neck:     No masses; no thyromegaly Lungs:    Clear to auscultation bilaterally; normal respiratory effort CV:         Regular rate and rhythm; no  murmurs Abd:      + bowel sounds; soft, non-tender; no palpable masses, no distension Ext:    No edema; adequate peripheral perfusion Neuro: pupils dilated and sluggish to light, not following commands, responds to painful simuli   Resolved problem list   Assessment and Plan  IPH Reversed Coumadin with kcentra  and vit K, INR 1.3 today Keppra  for seizure prophylaxis Management per neurology  Septic Shock, present on admission due to Staph Aureus Bacteremia, concern for UTI and aspiration pneumonia vs CNS infection Elevated lactic acid Continue ceftriaxone  and nafcillin  Concern for cystitis on CT scan Follow cultures TTE does not show valvular vegetations, plan for TEE Continue vasopressor support, MAP goal 65 or greater  Acute hypoxemic respiratory failure Continue mechanical ventilatory support Check tracheal aspirate Mental status is barrier to extubation Check ABG  Concern for Hypertrophic Cardiomyopathy or Infiltrative Cardiomyopathy Based on echo 6/21  AKI, baseline Cr 0.80 Non-anion gap metabolic acidosis Hypokalemia Monitor urine output and Cr.  Start bicarb drip   Afib, HTN Tele monitoring Check troponin Hold outpatient HTN meds  Diabetes SSI coverage 10 units semglee  4 units TF coverage q4hr  Best Practice (right click and Reselect all SmartList Selections daily)   Diet/type: tubefeeds DVT prophylaxis SCD Pressure ulcer(s): N/A GI prophylaxis: PPI Lines: Central line and Arterial Line Foley:  Yes, and it is still needed Code Status:  full code Last date of multidisciplinary goals of care discussion [Patient DNR now.  Pending further care discussions based on clinical course.]  Critical care time: 45 minutes   Dorn Chill, MD [facility] Office: [phone]   See Amion for personal pager PCCM on call pager [phone] until 7pm. Please call Elink 7p-7a. [phone]

## 2023-12-29 NOTE — Progress Notes (Addendum)
 STROKE TEAM PROGRESS NOTE    SIGNIFICANT HOSPITAL EVENTS  6/20: Presented from SNF d/t R gaze, sleepiness. 102 fever, elevated lactate, positive UA.   Last home coumadin dose 1700 6/19 CT Head with small L parietal ICH. CTA with no spot sign.   Blood cultures positive for MSSA bacteremia 6/21: MRI: Numerous infratentorial and supratentorial acute infarcts.   INTERIM HISTORY/SUBJECTIVE  Intubated. No sedation.  Does not open eyes, does not follow commands not blinking to threat, doll's eyes absent, not tracking.  Absent corneal reflex, present cough and gag reflex.  She is breathing over the vent with vent dyssynchrony.  Withdraws BLE minimally  Presentation of symptoms, labs, imaging showing multiple embolic infarcts warrants TEE to evaluate for endocarditis--pending cardiology to schedule. Will need to have tube feeds stopped at midnight prior.   Thorough bedside discussion had with patient's son and daughter regarding patient's poor prognosis and critical illnesses. Patient's code status changed to DNR per their wishes.Family states they are comfortable with their decision and all questions have been answered.   OBJECTIVE  CBC    Component Value Date/Time   WBC 18.1 (H) 12/29/2023 0508   RBC 3.73 (L) 12/29/2023 0508   HGB 10.5 (L) 12/29/2023 0508   HCT 31.7 (L) 12/29/2023 0508   PLT 115 (L) 12/29/2023 0508   MCV 85.0 12/29/2023 0508   MCH 28.2 12/29/2023 0508   MCHC 33.1 12/29/2023 0508   RDW 15.8 (H) 12/29/2023 0508   LYMPHSABS 0.1 (L) 12/27/2023 1254   MONOABS 0.7 12/27/2023 1254   EOSABS 0.0 12/27/2023 1254   BASOSABS 0.0 12/27/2023 1254    BMET    Component Value Date/Time   NA 122 (L) 12/29/2023 0508   K 4.4 12/29/2023 0508   CL 97 (L) 12/29/2023 0508   CO2 12 (L) 12/29/2023 0508   GLUCOSE 403 (H) 12/29/2023 0508   BUN 63 (H) 12/29/2023 0508   CREATININE 2.33 (H) 12/29/2023 0508   CALCIUM  7.5 (L) 12/29/2023 0508   GFRNONAA 22 (L) 12/29/2023 0508    IMAGING  past 24 hours MR BRAIN W WO CONTRAST Result Date: 12/28/2023 CLINICAL DATA:  Neuro deficit, acute, stroke suspected EXAM: MRI HEAD WITHOUT AND WITH CONTRAST TECHNIQUE: Multiplanar, multiecho pulse sequences of the brain and surrounding structures were obtained without and with intravenous contrast. CONTRAST:  10mL GADAVIST GADOBUTROL 1 MMOL/ML IV SOLN COMPARISON:  None Available. FINDINGS: Brain: Numerous acute infarcts involving bilateral frontal, parietal, and occipital lobes as well as the left thalamus and and bilateral cerebellum. The most confluent infarcts are in the left parietal lobe and right occipital lobe. More curvilinear susceptibility artifact in the left frontal parietal lobe likely represents petechial hemorrhage. When comparing across modalities, unchanged size of a left precentral gyrus intraparenchymal hemorrhage. Numerous small foci of susceptibility artifact throughout the infratentorial and supratentorial brain, possible microemboli given these were not apparent on the prior MRI. Remote right frontoparietal infarct. Small remote right cerebellar infarcts. No pathologic enhancement. No evidence of a mass, midline shift or hydrocephalus. Cerebral atrophy. Vascular: Normal flow voids. Skull and upper cervical spine: Normal marrow signal. Sinuses/Orbits: Negative. IMPRESSION: 1. Numerous infratentorial and supratentorial acute infarcts, as detailed above. Given involvement of multiple vascular territories, consider an embolic etiology. 2. Numerous punctate foci of susceptibility artifact throughout the infratentorial and supratentorial brain which may represent microemboli versus chronic microhemorrhages. Amyloid angiopathy is a consideration, particularly given the known lobar hemorrhage. 3. When comparing across modalities, unchanged size of a left precentral gyrus intraparenchymal hemorrhage. Electronically Signed  By: Gilmore GORMAN Molt M.D.   On: 12/28/2023 14:50    Vitals:   12/29/23  1100 12/29/23 1101 12/29/23 1126 12/29/23 1200  BP:  (!) 145/60  132/61  Pulse: 69 69  66  Resp: (!) 30 (!) 29  (!) 28  Temp:   98.5 F (36.9 C)   TempSrc:   Axillary   SpO2: 98% 98%  97%  Weight:      Height:        PHYSICAL EXAM General: chronically and acutely ill  CV: Irregularly irregular heart rate and rhythm on monitor Respiratory:  Intubated, mechanically ventilated on full support. Labored abdominal breathing with vent dyssynchrony.   NEURO:  Intubated just off sedation.  Patient's eyes are closed, she does not open to voice or noxious stimuli.  She does not follow commands.  Eyes are midline with bilateral pupils reactive.  Left pupil is larger than right pupil. Left gaze preference. Absent corneal reflex, present cough and gag reflex.  Labored breathing over the vent with vent dyssynchrony Unable to test facial symmetry due to ETT tube present. Mildly withdraws BLE symmetrically.  No movement of BUE seen spontaneous or to command or in response to noxious stimuli. Sensation coordination and gait not tested  Most Recent NIH 37    ASSESSMENT/PLAN  [name] is a 73 y.o. female with hx of DM2, HTN, MS, bedbound in a SNF at baseline, Afib on warfarin who was BIB EMS after being found by staff at her facility to be sleepy with R gaze. Per notes, patient was found in the am but EMS was ot called until the afternoon.  On arrival, aphasic, weak all over but more so on the right than left and gaze deviation to the right. CT Head with L parietal small ICH.Labs with INR of 4, elevated lactate. Facility [facility] reviewed and shows that patient was given 3mg  of warfarin at 1700 6/20.  NIH on Admission 26.  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  No midline shift. Remote infarct right parietal temporal  lobes, Aspects 10 CTA head & neck - No LVO, approximate 40% stenosis at origins of both cervical ICAs. Severe stenosis origin of left vertebral artery. Irregularity and mild stenosis left M1.  Moderate multifocal stenosis proximal left M2 MRI w/wo Numerous infratentorial and supratentorial acute infarcts. Numerous punctate foci of susceptibility artifacts which may represent micro emboli versus chronic microhemorrhages. ?amyloid angiopathy 2D Echo: EF greater than 75%, severe concentric LVH, severely dilated left atria Recommend TEE for further evaluation for endocarditis. Pending Cardiology scheduling. LTM EEG moderate to severe diffuse encephalopathy. No seizures or epileptiform discharges -> off  LDL PENDING TG 254, HDL < 10 A1c 7.6 VTE prophylaxis - SCDs warfarin daily prior to admission, now on No antithrombotic due to ICH Therapy recommendations:  Pending Disposition: had family discussion today, now DNR  Acute respiratory failure ?Aspiration Pneumonia CCM management, appreciate assistance Mental status is currently barrier to extubation On vent but off sedation  Sepsis, Septic Shock MSSA bacteremia UTI  ? Endocarditis ? Meningitis  Tmax 102--100.5-afebrile Leukocytosis WBC 12.4--18.9-18.1 LA: 5.2-5.4-7.6-3.1 UA WBC > 50, likely source MSSA present on B Cx Not a candidate for LP due to IPH No vegetation on TTE, recommend TEE Requiring pressor support, goal MAP >65 ID on board, was on broad antibiotics -> nafcillin  and Rocephin   Atrial fibrillation Home Meds: Warfarin Reversed with Kcentra  INR 4.0->1.6->1.3 Continue telemetry monitoring Hold anticoagulation due to ICH  Hx of Hypertension Now with Hypotension Unstable Now requiring use of pressor support - levophed  BP goal < 160  Hyperlipidemia Home meds:  none TG 254, HDL < 10 LDL PENDING, goal < 70  Diabetes type II Uncontrolled Home meds: Humalog  HgbA1c 7.6, goal < 7.0 CBGs SSI Recommend close  follow-up with PCP for better DM control  Dysphagia Intubated Continue tube feeds, Vital 1.5 @ 35 ml/hr  AKI  creatinine 1.24--1.34--2.33 ? Renal septic emboli On bicarb CCM managing, appreciate help  Other Stroke Risk Factors Obesity, Body mass index is 35.18 kg/m., BMI >/= 30 associated with increased stroke risk, recommend weight loss, diet and exercise as appropriate  Advanced age  Other Active Problems Hx of MS - bedbound at SNF Hyponatremia, chronic, Na 126--127--125--122-125 Now on NaHCO3 gtt and Na tabs Q6h Elevated LFTs, AST/ALT 66/27 [facility]  Hospital day # 2   Pt seen by Neuro NP/APP and later by MD. Note/plan to be edited by MD as needed.    Rocky JAYSON Likes, DNP, AGACNP-BC [facility] Please use AMION for contact information & EPIC for messaging.  ATTENDING NOTE: I reviewed above note and agree with the assessment and plan. Pt was seen and examined.   Daughter and son are at the bedside. Pt had gradual decline over time in SNF, refusing  PT/OT, bedbound. Had GI issue one week PTA. Disease course more concerning for endocarditis with bacteremia. Will schedule for TEE. Continue Abx. Kidney function worsening, with low Na, on bicarb per CCM management. Continue supportive care. I had long discussion with son and daughter at bedside, updated pt current condition, treatment plan and potential poor prognosis, and answered all the questions. They expressed understanding and appreciation, and now code status DNR.    For detailed assessment and plan, please refer to above as I have made changes wherever appropriate.   Brittany Cummins, MD PhD Stroke Neurology 12/29/2023 3:23 PM  This patient is critically ill due to ICH, endocarditis, embolic strokes, fever, respiratory failure and at significant risk of neurological worsening, death form sepsis, septic shock, mycotic aneurysm rupture, stroke progression, heart failure. This patient's care requires constant  monitoring of vital signs, hemodynamics, respiratory and cardiac monitoring, review of multiple databases, neurological assessment, discussion with family, other specialists and medical decision making of high complexity. I spent 40 minutes of neurocritical care time in the care of this patient.  I discussed with Dr. Kara CCM.

## 2023-12-29 NOTE — Plan of Care (Signed)
 Problem: Nutrition: Goal: Dietary intake will improve Outcome: Progressing   Problem: Role Relationship: Goal: Method of communication will improve Outcome: Progressing   Problem: Nutrition: Goal: Adequate nutrition will be maintained Outcome: Progressing   Problem: Coping: Goal: Level of anxiety will decrease Outcome: Progressing   Problem: Safety: Goal: Ability to remain free from injury will improve Outcome: Progressing   Problem: Skin Integrity: Goal: Risk for impaired skin integrity will decrease Outcome: Progressing   Problem: Nutritional: Goal: Maintenance of adequate nutrition will improve Outcome: Progressing   Problem: Skin Integrity: Goal: Risk for impaired skin integrity will decrease Outcome: Progressing   Problem: Tissue Perfusion: Goal: Adequacy of tissue perfusion will improve Outcome: Progressing   Problem: Education: Goal: Knowledge of disease or condition will improve Outcome: Not Progressing Goal: Knowledge of secondary prevention will improve (MUST DOCUMENT ALL) Outcome: Not Progressing Goal: Knowledge of patient specific risk factors will improve (DELETE if not current risk factor) Outcome: Not Progressing   Problem: Intracerebral Hemorrhage Tissue Perfusion: Goal: Complications of Intracerebral Hemorrhage will be minimized Outcome: Not Progressing   Problem: Coping: Goal: Will verbalize positive feelings about self Outcome: Not Progressing Goal: Will identify appropriate support needs Outcome: Not Progressing   Problem: Health Behavior/Discharge Planning: Goal: Ability to manage health-related needs will improve Outcome: Not Progressing Goal: Goals will be collaboratively established with patient/family Outcome: Not Progressing   Problem: Self-Care: Goal: Ability to participate in self-care as condition permits will improve Outcome: Not Progressing Goal: Verbalization of feelings and concerns over difficulty with self-care will  improve Outcome: Not Progressing Goal: Ability to communicate needs accurately will improve Outcome: Not Progressing   Problem: Nutrition: Goal: Risk of aspiration will decrease Outcome: Not Progressing   Problem: Activity: Goal: Ability to tolerate increased activity will improve Outcome: Not Progressing   Problem: Respiratory: Goal: Ability to maintain a clear airway and adequate ventilation will improve Outcome: Not Progressing   Problem: Education: Goal: Knowledge of General Education information will improve Description: Including pain rating scale, medication(s)/side effects and non-pharmacologic comfort measures Outcome: Not Progressing   Problem: Health Behavior/Discharge Planning: Goal: Ability to manage health-related needs will improve Outcome: Not Progressing   Problem: Clinical Measurements: Goal: Ability to maintain clinical measurements within normal limits will improve Outcome: Not Progressing Goal: Will remain free from infection Outcome: Not Progressing Goal: Diagnostic test results will improve Outcome: Not Progressing Goal: Respiratory complications will improve Outcome: Not Progressing Goal: Cardiovascular complication will be avoided Outcome: Not Progressing   Problem: Activity: Goal: Risk for activity intolerance will decrease Outcome: Not Progressing   Problem: Elimination: Goal: Will not experience complications related to bowel motility Outcome: Not Progressing Goal: Will not experience complications related to urinary retention Outcome: Not Progressing   Problem: Pain Managment: Goal: General experience of comfort will improve and/or be controlled Outcome: Not Progressing   Problem: Education: Goal: Ability to describe self-care measures that may prevent or decrease complications (Diabetes Survival Skills Education) will improve Outcome: Not Progressing Goal: Individualized Educational Video(s) Outcome: Not Progressing   Problem:  Coping: Goal: Ability to adjust to condition or change in health will improve Outcome: Not Progressing   Problem: Fluid Volume: Goal: Ability to maintain a balanced intake and output will improve Outcome: Not Progressing   Problem: Health Behavior/Discharge Planning: Goal: Ability to identify and utilize available resources and services will improve Outcome: Not Progressing Goal: Ability to manage health-related needs will improve Outcome: Not Progressing   Problem: Metabolic: Goal: Ability to maintain appropriate glucose levels will improve Outcome: Not  Progressing   Problem: Nutritional: Goal: Progress toward achieving an optimal weight will improve Outcome: Not Progressing

## 2023-12-29 NOTE — Progress Notes (Signed)
 OT Cancellation Note  Patient Details Name: Brittany Huffman MRN: [id number] DOB: May 09, 1951   Cancelled Treatment:    Reason Eval/Treat Not Completed: Patient not medically ready;Active bedrest order. Intubated/sedated, will follow as see as able.   Etta NOVAK, OT Acute Rehabilitation Services Office [phone] Secure Chat Preferred    Etta GORMAN Hope 12/29/2023, 10:30 AM

## 2023-12-29 NOTE — Progress Notes (Signed)
 0100; pt only produced 60ml output for shift Elink notified and referred to day shift no new orders at this time  0330; pt blood sugars over 300 all night elink notified; referred to day shift no new orders at this time

## 2023-12-29 NOTE — Progress Notes (Signed)
 Whitney NP (CCM) notified for shift UOP 5 cc total. BMP sent, scheduled albumin ordered.   GLEN[place]OD Darryle Lunger, RN

## 2023-12-29 NOTE — Progress Notes (Addendum)
 [facility]  Date of Admission:  12/27/2023   Total days of inpatient antibiotics 2  Principal Problem:   ICH (intracerebral hemorrhage) (HCC) Active Problems:   Sepsis [facility])          Assessment: 73 year old female withdiabetes type 2, hypertension, MS, bedbound [facility]FAt baseline, afebrile warfarin presented with rightward gaze, aphasia, right-sided weakness worse than left found to have left parietal small ICH admitted for further management found to have #MSSA bacteremia with  emboli to brain hx of recent fall #concern for meningitis #Left parietal ICH #intubated -Patient has been febrile on arrival, LKW day prior to admission.  CT head showed 2.1 cm parenchymal hematoma with surrounding edema and local mass effect.  MRI brain has been ordered.  Neurology engaged  EEG done which suggested moderate to severe encephalopathy. Not lp candidate due to IPH. MRI brain with numerous infarcts suspicious for emboli -Critical care was consulted noted consider LP after INR reversed. She also had a nontunneled central venous catheter placed on 6/20.   - Blood cultures admission grew 2/2 MSSA.   - CT chest abdomen pelvis showed scattered atelectasis in the lung notable in lower lobes, suggestions of bronchitis, wall thickening and urinary bladder possible cystitis. -Etiology of bacteremia unclear, she had a recent fall and was seen in ED on 6/5 at [facility]. Her left  arm is a bit swollen. I don't appreciate overt signs of phlebitis but she does have some scabbed over skin with erythema.  Recommendations: - Discontinue ceftriaxone , UA with insignificant growth - Continue nafcillin  - Follow repeat blood cultures to ensure clearance - TTE no veg, will need TEE - Replace line, patient has central line. - Standard precautions     Microbiology:   Antibiotics: Nafcillin  and ctx   Cultures: Blood 6/20 2/2 MSSA 6/21 SUBJECTIVE: Intubated Interval: afebrile  overnight  Review of Systems: ROS   Scheduled Meds:  Chlorhexidine  Gluconate Cloth  6 each Topical Q0600   docusate  100 mg Per Tube BID   famotidine   10 mg Per Tube Daily   feeding supplement (PROSource TF20)  60 mL Per Tube QID   feeding supplement (VITAL 1.5 CAL)  1,000 mL Per Tube Q24H   heparin  injection (subcutaneous)  5,000 Units Subcutaneous Q8H   insulin  aspart  0-20 Units Subcutaneous Q4H   insulin  aspart  4 Units Subcutaneous Q4H   insulin  glargine-yfgn  10 Units Subcutaneous Daily   multivitamin with minerals  1 tablet Per Tube Daily   mupirocin  ointment  1 Application Nasal BID   mouth rinse  15 mL Mouth Rinse Q2H   polyethylene glycol  17 g Per Tube Daily   senna-docusate  1 tablet Per Tube BID   sodium chloride   1 g Per Tube Q8H   Continuous Infusions:  albumin human 60 mL/hr at 12/29/23 2300   cefTRIAXone  (ROCEPHIN )  IV Stopped (12/29/23 0952)   nafcillin  12 g in sodium chloride  0.9 % 500 mL continuous infusion 20.8 mL/hr at 12/29/23 2300   norepinephrine  (LEVOPHED ) Adult infusion 8 mcg/min (12/29/23 2300)   propofol  (DIPRIVAN ) infusion Stopped (12/28/23 0901)   sodium bicarbonate 150 mEq in sterile water 1,150 mL infusion 75 mL/hr at 12/29/23 2300   PRN Meds:.acetaminophen  **OR** acetaminophen  (TYLENOL ) oral liquid 160 mg/5 mL **OR** acetaminophen , fentaNYL  (SUBLIMAZE ) injection, fentaNYL  (SUBLIMAZE ) injection, mouth rinse No Known Allergies  OBJECTIVE: Vitals:   12/29/23 2300 12/29/23 2315 12/29/23 2328 12/29/23 2329  BP: (!) 135/57 ROLLEN)  134/55 (!) 145/61   Pulse: 60 (!) 59 (!) 59   Resp: (!) 22 (!) 24 (!) 25   Temp: 99.7 F (37.6 C) 99.9 F (37.7 C) 99.9 F (37.7 C)   TempSrc:      SpO2: 100% 100% 100% 100%  Weight:      Height:       Body mass index is 35.18 kg/m.  Physical Exam Constitutional:      Comments: intubated  HENT:     Head: Normocephalic and atraumatic.     Right Ear: Tympanic membrane normal.     Left Ear: Tympanic membrane  normal.     Nose: Nose normal.     Mouth/Throat:     Mouth: Mucous membranes are moist.   Eyes:     Extraocular Movements: Extraocular movements intact.     Conjunctiva/sclera: Conjunctivae normal.     Pupils: Pupils are equal, round, and reactive to light.    Cardiovascular:     Rate and Rhythm: Normal rate and regular rhythm.     Heart sounds: No murmur heard.    No friction rub. No gallop.  Pulmonary:     Effort: Pulmonary effort is normal.     Breath sounds: Normal breath sounds.  Abdominal:     General: Abdomen is flat.     Palpations: Abdomen is soft.   Skin:    General: Skin is warm and dry.   Psychiatric:        Mood and Affect: Mood normal.       Lab Results Lab Results  Component Value Date   WBC 18.1 (H) 12/29/2023   HGB 10.5 (L) 12/29/2023   HCT 31.7 (L) 12/29/2023   MCV 85.0 12/29/2023   PLT 115 (L) 12/29/2023    Lab Results  Component Value Date   CREATININE 2.61 (H) 12/29/2023   BUN 74 (H) 12/29/2023   NA 125 (L) 12/29/2023   K 4.0 12/29/2023   CL 94 (L) 12/29/2023   CO2 16 (L) 12/29/2023    Lab Results  Component Value Date   ALT 28 12/29/2023   AST 47 (H) 12/29/2023   ALKPHOS 79 12/29/2023   BILITOT 3.4 (H) 12/29/2023        Loney Stank, MD [facility] 12/29/2023, 11:47 PM   Evaluation of this patient requires complex antimicrobial therapy evaluation and counseling + isolation needs for disease transmission risk assessment and mitigation

## 2023-12-29 NOTE — Progress Notes (Signed)
 PT Cancellation Note  Patient Details Name: Brittany Huffman MRN: [id number] DOB: Sep 06, 1950   Cancelled Treatment:    Reason Eval/Treat Not Completed: Active bedrest order;Other (comment). Discussed with RN Darryle who recommends continued hold on PT evaluation. Pt remains intubated with no response. Will continue to follow and check tomorrow for appropriateness.   Izetta Call, PT, DPT   Acute Rehabilitation Department Office [phone] Secure Chat Communication Preferred   Izetta JULIANNA Call 12/29/2023, 10:34 AM

## 2023-12-30 DIAGNOSIS — R29737 NIHSS score 37: Secondary | ICD-10-CM | POA: Diagnosis not present

## 2023-12-30 DIAGNOSIS — E119 Type 2 diabetes mellitus without complications: Secondary | ICD-10-CM | POA: Diagnosis not present

## 2023-12-30 DIAGNOSIS — I6389 Other cerebral infarction: Secondary | ICD-10-CM | POA: Diagnosis not present

## 2023-12-30 DIAGNOSIS — B9561 Methicillin susceptible Staphylococcus aureus infection as the cause of diseases classified elsewhere: Secondary | ICD-10-CM | POA: Insufficient documentation

## 2023-12-30 DIAGNOSIS — J9601 Acute respiratory failure with hypoxia: Secondary | ICD-10-CM | POA: Diagnosis not present

## 2023-12-30 DIAGNOSIS — I611 Nontraumatic intracerebral hemorrhage in hemisphere, cortical: Secondary | ICD-10-CM | POA: Diagnosis not present

## 2023-12-30 DIAGNOSIS — A4101 Sepsis due to Methicillin susceptible Staphylococcus aureus: Secondary | ICD-10-CM | POA: Diagnosis not present

## 2023-12-30 DIAGNOSIS — I6340 Cerebral infarction due to embolism of unspecified cerebral artery: Secondary | ICD-10-CM | POA: Diagnosis not present

## 2023-12-30 DIAGNOSIS — N179 Acute kidney failure, unspecified: Secondary | ICD-10-CM | POA: Insufficient documentation

## 2023-12-30 LAB — CULTURE, BLOOD (ROUTINE X 2): Special Requests: ADEQUATE

## 2023-12-30 LAB — LIPID PANEL
Cholesterol: 103 mg/dL (ref 0–200)
HDL: <10 mg/dL — ABNORMAL LOW
Triglycerides: 185 mg/dL — ABNORMAL HIGH
VLDL: 37 mg/dL (ref 0–40)

## 2023-12-30 LAB — CBC
HCT: 30.5 % — ABNORMAL LOW (ref 36.0–46.0)
Hemoglobin: 10.2 g/dL — ABNORMAL LOW (ref 12.0–15.0)
MCH: 27.9 pg (ref 26.0–34.0)
MCHC: 33.4 g/dL (ref 30.0–36.0)
MCV: 83.3 fL (ref 80.0–100.0)
Platelets: 141 10*3/uL — ABNORMAL LOW (ref 150–400)
RBC: 3.66 MIL/uL — ABNORMAL LOW (ref 3.87–5.11)
RDW: 15.9 % — ABNORMAL HIGH (ref 11.5–15.5)
WBC: 23.3 10*3/uL — ABNORMAL HIGH (ref 4.0–10.5)
nRBC: 0 % (ref 0.0–0.2)

## 2023-12-30 LAB — POCT I-STAT 7, (LYTES, BLD GAS, ICA,H+H)
Acid-base deficit: 3 mmol/L — ABNORMAL HIGH (ref 0.0–2.0)
Bicarbonate: 19.6 mmol/L — ABNORMAL LOW (ref 20.0–28.0)
Calcium, Ion: 1.07 mmol/L — ABNORMAL LOW (ref 1.15–1.40)
HCT: 32 % — ABNORMAL LOW (ref 36.0–46.0)
Hemoglobin: 10.9 g/dL — ABNORMAL LOW (ref 12.0–15.0)
O2 Saturation: 100 %
Patient temperature: 37.3
Potassium: 4.1 mmol/L (ref 3.5–5.1)
Sodium: 125 mmol/L — ABNORMAL LOW (ref 135–145)
TCO2: 20 mmol/L — ABNORMAL LOW (ref 22–32)
pCO2 arterial: 28.7 mmHg — ABNORMAL LOW (ref 32–48)
pH, Arterial: 7.444 (ref 7.35–7.45)
pO2, Arterial: 183 mmHg — ABNORMAL HIGH (ref 83–108)

## 2023-12-30 LAB — BASIC METABOLIC PANEL WITH GFR
Anion gap: 16 — ABNORMAL HIGH (ref 5–15)
BUN: 97 mg/dL — ABNORMAL HIGH (ref 8–23)
CO2: 18 mmol/L — ABNORMAL LOW (ref 22–32)
Calcium: 7.7 mg/dL — ABNORMAL LOW (ref 8.9–10.3)
Chloride: 93 mmol/L — ABNORMAL LOW (ref 98–111)
Creatinine, Ser: 3.18 mg/dL — ABNORMAL HIGH (ref 0.44–1.00)
GFR, Estimated: 15 mL/min — ABNORMAL LOW
Glucose, Bld: 286 mg/dL — ABNORMAL HIGH (ref 70–99)
Potassium: 4.2 mmol/L (ref 3.5–5.1)
Sodium: 127 mmol/L — ABNORMAL LOW (ref 135–145)

## 2023-12-30 LAB — GLUCOSE, CAPILLARY
Glucose-Capillary: 287 mg/dL — ABNORMAL HIGH (ref 70–99)
Glucose-Capillary: 267 mg/dL — ABNORMAL HIGH (ref 70–99)
Glucose-Capillary: 247 mg/dL — ABNORMAL HIGH (ref 70–99)

## 2023-12-30 LAB — CULTURE, RESPIRATORY W GRAM STAIN: Culture: NORMAL

## 2023-12-30 MED ORDER — ORAL CARE MOUTH RINSE
15.0000 mL | OROMUCOSAL | Status: DC | PRN
Start: 2023-12-30 — End: 2023-12-30

## 2023-12-30 MED ORDER — VASOPRESSIN 20 UNITS/100 ML INFUSION FOR SHOCK
0.0000 [IU]/min | INTRAVENOUS | Status: DC
  Administered 2023-12-30: 0.04 [IU]/min via INTRAVENOUS
  Administered 2023-12-30: 0.03 [IU]/min via INTRAVENOUS
  Filled 2023-12-30 (×2): qty 100

## 2023-12-30 MED ORDER — CALCIUM GLUCONATE-NACL 1-0.675 GM/50ML-% IV SOLN
1.0000 g | Freq: Once | INTRAVENOUS | Status: AC
  Administered 2023-12-30: 1000 mg via INTRAVENOUS
  Filled 2023-12-30: qty 50

## 2023-12-30 MED ORDER — MORPHINE 100MG IN NS 100ML (1MG/ML) PREMIX INFUSION: 0.0000 mg/h | INTRAVENOUS | Status: DC

## 2023-12-30 MED ORDER — MORPHINE 100MG IN NS 100ML (1MG/ML) PREMIX INFUSION
INTRAVENOUS | Status: AC
  Administered 2023-12-30: 5 mg/h via INTRAVENOUS
  Filled 2023-12-30: qty 100

## 2023-12-30 MED ORDER — GLYCOPYRROLATE 0.2 MG/ML IJ SOLN: 0.2000 mg | INTRAMUSCULAR | Status: DC | PRN

## 2023-12-30 MED ORDER — NOREPINEPHRINE 4 MG/250ML-% IV SOLN
0.0000 ug/min | INTRAVENOUS | Status: DC
  Administered 2023-12-30: 16 ug/min via INTRAVENOUS
  Filled 2023-12-30: qty 250

## 2023-12-30 MED ORDER — MORPHINE BOLUS VIA INFUSION
5.0000 mg | INTRAVENOUS | Status: DC | PRN
  Administered 2023-12-30 (×2): 5 mg via INTRAVENOUS

## 2023-12-30 MED ORDER — SODIUM CHLORIDE 0.9% FLUSH: 3.0000 mL | Freq: Two times a day (BID) | INTRAVENOUS | Status: DC

## 2023-12-30 MED ORDER — GLYCOPYRROLATE 1 MG PO TABS: 1.0000 mg | ORAL_TABLET | ORAL | Status: DC | PRN

## 2023-12-30 MED ORDER — MIDAZOLAM HCL 2 MG/2ML IJ SOLN: 2.0000 mg | INTRAMUSCULAR | Status: DC | PRN

## 2023-12-30 MED ORDER — SODIUM CHLORIDE 0.9% FLUSH: 3.0000 mL | INTRAVENOUS | Status: DC | PRN

## 2023-12-30 MED ORDER — PANTOPRAZOLE SODIUM 40 MG IV SOLR: 40.0000 mg | INTRAVENOUS | Status: DC

## 2023-12-30 MED ORDER — POLYVINYL ALCOHOL 1.4 % OP SOLN: 1.0000 [drp] | Freq: Four times a day (QID) | OPHTHALMIC | Status: DC | PRN

## 2024-01-02 LAB — CULTURE, BLOOD (ROUTINE X 2): Culture: NO GROWTH

## 2024-01-07 NOTE — Progress Notes (Addendum)
 eLink Physician-Brief Progress Note Patient Name: Brittany Huffman DOB: 03-07-1951 MRN: [id number]   Date of Service  12/14/2023  HPI/Events of Note  BSRN has had to increase levo up to 19mcq from at 0230       uncertanity for goals today, CRRT vs comfort, labs are about to be drawn after receiving scheduled albumin,  asking for MAP goal to be changed from 65 to 60   on vent with levo/bicarb  Septic shock from UTI, asp pneumonia and MRI numerous infarcts. DNR status. Concerns for meningitis.  MSSA on 4 bottles bacteremia.  Camera: SBP on art line now at 151. In synch with vent.  Discussed with RN.    eICU Interventions  Start Vasopressin and wean levo down as tolerated. Get ABG.      Intervention Category Intermediate Interventions: Hypotension - evaluation and management  [name] 12/29/2023, 4:17 AM  04:36 ABG's in epic now with Ionized calcium  1.07     also asking to decrease MAP goal     cbc  back with [facility]. Discussed with RN.  - keep MAP > 65 for both p[pressors. -1gm calcium  gluconate ordered

## 2024-01-07 NOTE — Progress Notes (Signed)
   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 Chart reviewed and case discussed with Dr. Pietro- patient is DNR, considering transition to comfort care. She is critically ill with sepsis, multifocal stroke, multiorgan failure. Per Dr. Pietro, no TEE at this time. If felt that TEE is absolutely necessary for her care, please reach out to Dr. Pietro to discuss further  Signed, Rollo FABIENE Louder, PA-C  12/22/2023 7:33 AM

## 2024-01-07 NOTE — Progress Notes (Signed)
 Transition of Care [facility]) - Inpatient Brief Assessment   Patient Details  Name: Brittany Huffman MRN: [id number] Date of Birth: Aug 05, 1950  Transition of [facility]) CM/SW Contact:    Robynn Eileen Hoose, RN Phone Number: 12/23/2023, 3:33 PM   Clinical Narrative:  Pt from SNF facility for altered mental status, fever, stroke. Patient transitioned to inpatient comfort care.    Transition of Care Asessment: Insurance and Status: Insurance coverage has been reviewed Patient has primary care physician:  (None listed. Came from SNF) Home environment has been reviewed: SNF Prior level of function:: Dependent Prior/Current Home Services: Current home services (SNF) Social Drivers of Health Review: SDOH reviewed no interventions necessary Readmission risk has been reviewed: Yes Transition of care needs: (P) no transition of care needs at this time (Pt transitioned to inpatient comfort care)

## 2024-01-07 NOTE — Death Summary Note (Incomplete)
 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 year old female with hx of DM2, HTN, MS, bedbound in a SNF at baseline, Afib on warfarin who was BIB EMS after being found by staff at her facility to be sleepy with R gaze. Per notes, patient was found in the am but EMS was ot called until the afternoon.  On arrival, aphasic, weak all over but more so on the right than left and gaze deviation to the right. CT Head with L parietal small ICH.Labs with INR of 4, elevated lactate. Facility [facility] reviewed and shows that patient was given 3mg  of warfarin at 1700 01/19/24.  NIH on Admission 26.  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  No midline shift. Remote infarct right parietal temporal lobes, Aspects 10 CTA head & neck - No LVO, approximate 40% stenosis at origins of both cervical ICAs. Severe stenosis origin of left vertebral artery. Irregularity and mild stenosis left M1.  Moderate multifocal stenosis proximal left M2 MRI w/wo Numerous  infratentorial and supratentorial acute infarcts. Numerous punctate foci of susceptibility artifacts which may represent micro emboli versus chronic microhemorrhages. ?amyloid angiopathy 2D Echo: EF greater than 75%, severe concentric LVH, severely dilated left atria LTM EEG moderate to severe diffuse encephalopathy. No seizures or epileptiform discharges -> off  LDL no able to calculate due to low HLD TG 254, HDL < 10 A1c 7.6 VTE prophylaxis - SCDs warfarin daily prior to admission, was on No antithrombotic due to ICH Disposition: had family discussion 6/22, put on Comfort care Jan 22, 2024, pt passed away 01-22-2024 @1750   Acute respiratory failure ?Aspiration Pneumonia CCM management Intubated  Sepsis, Septic Shock MSSA bacteremia UTI  ? Endocarditis ? Meningitis  Tmax 102--100.5-afebrile Leukocytosis WBC 12.4--18.9-18.1 -23.3 LA: 5.2-5.4-7.6-3.1 UA WBC > 50, likely source MSSA present on B Cx Not a candidate for LP due to IPH No vegetation on TTE Requiring pressor support, goal MAP >65 ID on board, was on broad antibiotics -> was on nafcillin  and Rocephin    Atrial fibrillation Home Meds: Warfarin Reversed with Kcentra  INR 4.0->1.6->1.3 Hold anticoagulation due to ICH   Hx of Hypertension Now with Hypotension Unstable was requiring use of pressor support - levophed    Hyperlipidemia Home meds:  none TG 254, HDL < 10 LDL not able to calculate due to low HDL    Diabetes type II Uncontrolled Home meds: Humalog  HgbA1c 7.6 CBGs, SSI   AKI, worsening creatinine 1.24--1.34--2.33 -> 3.18 ? Renal septic emboli   Other Stroke Risk Factors Obesity, Body mass index is 36.76 kg/m., BMI >/= 30 associated with increased stroke risk, recommend weight loss,  diet and exercise as appropriate  Advanced age   Other Active Problems Hx of MS - bedbound at SNF Hyponatremia, chronic, Na 126--127--125--122-125 Now on NaHCO3 gtt and Na tabs Q6h Elevated LFTs, AST/ALT 66/27 -  47/28   Pertinent Labs and Studies  Significant Diagnostic Studies MR BRAIN W WO CONTRAST Result Date: 12/28/2023 CLINICAL DATA:  Neuro deficit, acute, stroke suspected EXAM: MRI HEAD WITHOUT AND WITH CONTRAST TECHNIQUE: Multiplanar, multiecho pulse sequences of the brain and surrounding structures were obtained without and with intravenous contrast. CONTRAST:  10mL GADAVIST GADOBUTROL 1 MMOL/ML IV SOLN COMPARISON:  None Available. FINDINGS: Brain: Numerous acute infarcts involving bilateral frontal, parietal, and occipital lobes as well as the left thalamus and and bilateral cerebellum. The most confluent infarcts are in the left parietal lobe and right occipital lobe. More curvilinear susceptibility artifact in the left frontal parietal lobe likely represents petechial hemorrhage. When comparing across modalities, unchanged size of a left precentral gyrus intraparenchymal hemorrhage. Numerous small foci of susceptibility artifact throughout the infratentorial and supratentorial brain, possible microemboli given these were not apparent on the prior MRI. Remote right frontoparietal infarct. Small remote right cerebellar infarcts. No pathologic enhancement. No evidence of a mass, midline shift or hydrocephalus. Cerebral atrophy. Vascular: Normal flow voids. Skull and upper cervical spine: Normal marrow signal. Sinuses/Orbits: Negative. IMPRESSION: 1. Numerous infratentorial and supratentorial acute infarcts, as detailed above. Given involvement of multiple vascular territories, consider an embolic etiology. 2. Numerous punctate foci of susceptibility artifact throughout the infratentorial and supratentorial brain which may represent microemboli versus chronic microhemorrhages. Amyloid angiopathy is a consideration, particularly given the known lobar hemorrhage. 3. When comparing across modalities, unchanged size of a left precentral gyrus intraparenchymal hemorrhage. Electronically Signed   By: Gilmore GORMAN Molt M.D.   On: 12/28/2023 14:50   ECHOCARDIOGRAM COMPLETE Result Date: 12/28/2023    ECHOCARDIOGRAM REPORT   Patient Name:   Brittany Huffman Date of Exam: 12/28/2023 Medical Rec #:  [id number]     Height:       70.0 in Accession #:    [id number]    Weight:       229.3 lb Date of Birth:  Sep 24, 1950    BSA:          2.212 m Patient Age:    72 years      BP:           119/58 mmHg Patient Gender: F             HR:           67 bpm. Exam Location:  Inpatient Procedure: 2D Echo, Cardiac Doppler and Color Doppler (Both Spectral and Color            Flow Doppler were utilized during procedure). Indications:    Stroke  History:        Patient has prior history of Echocardiogram examinations, most                 recent 05/12/2017.  Sonographer:    Eva Lash Referring Phys: [id number] [facility]  Sonographer Comments: Technically difficult study due to poor echo windows. IMPRESSIONS  1. Consider possible HCM or infiltrative cardiomyopathy given IVSd of 1.9 cm. Left ventricular ejection fraction, by estimation, is >75%. The left ventricle has hyperdynamic function. The left ventricle has no regional wall motion abnormalities. There is severe concentric left ventricular hypertrophy of the basal-septal segment. Left ventricular diastolic parameters are indeterminate.  2. Right ventricular systolic function was not well visualized.  The right ventricular size is not well visualized. There is normal pulmonary artery systolic pressure. The estimated right ventricular systolic pressure is 28.7 mmHg.  3. Left atrial size was severely dilated.  4. The mitral valve is degenerative. Trivial mitral valve regurgitation. Mild mitral stenosis. The mean mitral valve gradient is 5.0 mmHg with average heart rate of 67 bpm. Moderate to severe mitral annular calcification.  5. The aortic valve is tricuspid. There is moderate calcification of the aortic valve. Aortic valve regurgitation is not visualized. Mild to moderate aortic valve  stenosis. Aortic valve area, by VTI measures 1.56 cm. Aortic valve mean gradient measures  9.0 mmHg. Aortic valve Vmax measures 2.09 m/s. Peak gradient 17.5 mmHg, DI 0.50.  6. Aortic dilatation noted. There is borderline dilatation of the ascending aorta, measuring 38 mm.  7. The inferior vena cava is dilated in size with <50% respiratory variability, suggesting right atrial pressure of 15 mmHg. FINDINGS  Left Ventricle: Consider possible HCM or infiltrative cardiomyopathy given IVSd of 1.9 cm. Left ventricular ejection fraction, by estimation, is >75%. The left ventricle has hyperdynamic function. The left ventricle has no regional wall motion abnormalities. Definity  contrast agent was given IV to delineate the left ventricular endocardial borders. The left ventricular internal cavity size was normal in size. There is severe concentric left ventricular hypertrophy of the basal-septal segment. Left ventricular diastolic parameters are indeterminate. Right Ventricle: The right ventricular size is not well visualized. Right vetricular wall thickness was not well visualized. Right ventricular systolic function was not well visualized. There is normal pulmonary artery systolic pressure. The tricuspid regurgitant velocity is 1.85 m/s, and with an assumed right atrial pressure of 15 mmHg, the estimated right ventricular systolic pressure is 28.7 mmHg. Left Atrium: Left atrial size was severely dilated. Right Atrium: Right atrial size was normal in size. Pericardium: There is no evidence of pericardial effusion. Mitral Valve: The mitral valve is degenerative in appearance. Moderate to severe mitral annular calcification. Trivial mitral valve regurgitation. Mild mitral valve stenosis. MV peak gradient, 12.2 mmHg. The mean mitral valve gradient is 5.0 mmHg with average heart rate of 67 bpm. Tricuspid Valve: The tricuspid valve is grossly normal. Tricuspid valve regurgitation is trivial. Aortic Valve: The aortic valve is  tricuspid. There is moderate calcification of the aortic valve. Aortic valve regurgitation is not visualized. Mild to moderate aortic stenosis is present. Aortic valve mean gradient measures 9.0 mmHg. Aortic valve peak gradient measures 17.5 mmHg. Aortic valve area, by VTI measures 1.56 cm. Pulmonic Valve: The pulmonic valve was grossly normal. Pulmonic valve regurgitation is not visualized. Aorta: Aortic dilatation noted. There is borderline dilatation of the ascending aorta, measuring 38 mm. Venous: The inferior vena cava is dilated in size with less than 50% respiratory variability, suggesting right atrial pressure of 15 mmHg. IAS/Shunts: The interatrial septum was not well visualized.  LEFT VENTRICLE PLAX 2D LVIDd:         3.90 cm LVIDs:         2.50 cm LV PW:         1.90 cm LV IVS:        1.90 cm LVOT diam:     2.00 cm LV SV:         52 LV SV Index:   23 LVOT Area:     3.14 cm  LV Volumes (MOD) LV vol d, MOD A2C: 103.0 ml LV vol d, MOD A4C: 73.5 ml LV vol s, MOD A2C: 13.4 ml LV vol s, MOD  A4C: 16.3 ml LV SV MOD A2C:     89.6 ml LV SV MOD A4C:     73.5 ml LV SV MOD BP:      70.5 ml RIGHT VENTRICLE RV S prime:     12.80 cm/s LEFT ATRIUM              Index LA Vol (A2C):   92.3 ml  41.72 ml/m LA Vol (A4C):   101.0 ml 45.66 ml/m LA Biplane Vol: 103.0 ml 46.56 ml/m  AORTIC VALVE AV Area (Vmax):    1.39 cm AV Area (Vmean):   1.37 cm AV Area (VTI):     1.56 cm AV Vmax:           209.00 cm/s AV Vmean:          132.000 cm/s AV VTI:            0.332 m AV Peak Grad:      17.5 mmHg AV Mean Grad:      9.0 mmHg LVOT Vmax:         92.70 cm/s LVOT Vmean:        57.500 cm/s LVOT VTI:          0.165 m LVOT/AV VTI ratio: 0.50  AORTA Ao Asc diam: 3.80 cm MITRAL VALVE            TRICUSPID VALVE MV Peak grad: 12.2 mmHg TR Peak grad:   13.7 mmHg MV Mean grad: 5.0 mmHg  TR Vmax:        185.00 cm/s MV Vmax:      1.75 m/s MV Vmean:     98.2 cm/s SHUNTS                         Systemic VTI:  0.16 m                          Systemic Diam: 2.00 cm Vinie Maxcy MD Electronically signed by Vinie Maxcy MD Signature Date/Time: 12/28/2023/11:32:17 AM    Final    Overnight EEG with video Result Date: 12/28/2023 Shelton Arlin KIDD, MD     12/29/2023  8:57 AM Patient Name: Antara Brecheisen MRN: [id number] Epilepsy Attending: Arlin KIDD Shelton Referring Physician/Provider: Khaliqdina, Salman, MD Duration: 12/27/2023 1655 to 12/28/2023 1655 Patient history:  73 y.o. female with hx of DM2, HTN, MS, bedbound and in a SNF at baseline, afibb on warfarin who was last seen normal yesterday by her team. This AM, was noted by staff at her facility to be sleepy and R gaze. CTH showed  intraparenchymal hemorrhage at the left precentral gyrus. EEG to evaluate for seizure. Level of alertness: comatose/ lethargic AEDs during EEG study: Propofol  Technical aspects: This EEG study was done with scalp electrodes positioned according to the 10-20 International system of electrode placement. Electrical activity was reviewed with band pass filter of 1-70Hz , sensitivity of 7 uV/mm, display speed of 4mm/sec with a 60Hz  notched filter applied as appropriate. EEG data were recorded continuously and digitally stored.  Video monitoring was available and reviewed as appropriate. Description: EEG showed continuous generalized 3 to 6 Hz theta-delta slowing. Hyperventilation and photic stimulation were not performed.  Event button was pressed on 12/28/2023 at 1928 for unclear reason. Concomitant eeg before, during and after the event didn't show any eeg changes.  Event button was pressed on 12/28/2023 at 2115 for right arm twitching. Concomitant eeg before, during and after  the event didn't show any eeg changes.  ABNORMALITY - Continuous slow, generalized  IMPRESSION: This study is  suggestive of moderate to severe diffuse encephalopathy. No definite epileptiform discharges were seen throughout the recording. Event button was pressed on 12/28/2023 at 1928 for unclear reason.  Concomitant eeg before, during and after the event didn't show any eeg changes. Event button was pressed on 12/28/2023 at 2115 for right arm twitching without concomitant eeg change. However, semiology of event was concerning for focal motor seizure which may not be seen on scalp eeg.  Arlin MALVA Krebs    DG CHEST PORT 1 VIEW Result Date: 12/27/2023 CLINICAL DATA:  Central line placement EXAM: PORTABLE CHEST 1 VIEW COMPARISON:  12/27/2023 FINDINGS: Endotracheal tube tip 3.9 cm above the carina, satisfactorily positioned. A left IJ central line is noted with tip at the junction of the brachiocephalic vein and the SVC with tip oriented rightwards but not definitively abutting the wall of the SVC. No pneumothorax. Nasogastric tube enters the stomach. Atherosclerotic calcification of the aortic arch. Mild enlargement of the cardiopericardial silhouette, without edema. No blunting of the costophrenic angles. IMPRESSION: 1. Left IJ central line tip at the junction of the brachiocephalic vein and the SVC with tip oriented rightwards but not definitively abutting the wall of the SVC. No pneumothorax. 2. Mild enlargement of the cardiopericardial silhouette, without edema. 3. Aortic Atherosclerosis (ICD10-I70.0). Electronically Signed   By: Ryan Salvage M.D.   On: 12/27/2023 21:55   CT HEAD POST STROKE FOLLOWUP/TIMED/STAT READ Result Date: 12/27/2023 CLINICAL DATA:  Code stroke.  Hemorrhage follow-up EXAM: CT HEAD WITHOUT CONTRAST TECHNIQUE: Contiguous axial images were obtained from the base of the skull through the vertex without intravenous contrast. RADIATION DOSE REDUCTION: This exam was performed according to the departmental dose-optimization program which includes automated exposure control, adjustment of the mA and/or kV according to patient size and/or use of iterative reconstruction technique. COMPARISON:  None Available. FINDINGS: Brain: Unchanged intraparenchymal hemorrhage at the left precentral  gyrus. Old right parietal infarct. Mild generalized volume loss. No hydrocephalus or new mass effect. Vascular: No hyperdense vessel or unexpected vascular calcification. Skull: The visualized skull base, calvarium and extracranial soft tissues are normal. Sinuses/Orbits: No fluid levels or advanced mucosal thickening of the visualized paranasal sinuses. No mastoid or middle ear effusion. Normal orbits. Other: None. IMPRESSION: 1. Unchanged intraparenchymal hemorrhage at the left precentral gyrus. 2. Old right parietal infarct. Electronically Signed   By: Franky Stanford M.D.   On: 12/27/2023 19:56   DG Abd 1 View Result Date: 12/27/2023 CLINICAL DATA:  Orogastric tube placement EXAM: ABDOMEN - 1 VIEW COMPARISON:  CT scan 12/27/2023 FINDINGS: Compared to the CT scan, the orogastric tube appears to have been advanced, with tip and side port now in the stomach body, satisfactorily positioned. Bandlike bibasilar atelectasis.  Atherosclerosis noted. IMPRESSION: 1. Orogastric tube tip and side port are now in the stomach body, satisfactorily positioned. 2. Bandlike bibasilar atelectasis. Electronically Signed   By: Ryan Salvage M.D.   On: 12/27/2023 18:41   CT CHEST ABDOMEN PELVIS WO CONTRAST Result Date: 12/27/2023 CLINICAL DATA:  Sepsis EXAM: CT CHEST, ABDOMEN AND PELVIS WITHOUT CONTRAST TECHNIQUE: Multidetector CT imaging of the chest, abdomen and pelvis was performed following the standard protocol without IV contrast. RADIATION DOSE REDUCTION: This exam was performed according to the departmental dose-optimization program which includes automated exposure control, adjustment of the mA and/or kV according to patient size and/or use of iterative reconstruction technique. COMPARISON:  CT abdomen  08/18/2021 FINDINGS: CT CHEST FINDINGS Cardiovascular: Coronary, aortic arch, and branch vessel atherosclerotic vascular disease. Moderate cardiomegaly. Very heavy annular calcification of the mitral valve. Mild  aortic valve calcification. Mediastinum/Nodes: Nasogastric tube tip is in the distal esophagus. If gastric placement is desired, advance 10 cm. Endotracheal tube satisfactorily positioned with tip 3.7 cm above the carina. Lungs/Pleura: Scattered atelectasis in the lungs most notable in the lower lobes. There is some mild mosaic attenuation in the right upper lobe possibly from air trapping. Airway thickening is present, suggesting bronchitis or reactive airways disease. Musculoskeletal: Unremarkable CT ABDOMEN PELVIS FINDINGS Hepatobiliary: Mildly distended gallbladder with multiple dependent gallstones. No significant biliary dilatation. Pancreas: Moderate pancreatic atrophy. Spleen: Unremarkable Adrenals/Urinary Tract: Fullness of the adrenal glands without discrete mass. Contrast medium in the renal collecting systems presumably left over from the patient's CT angiogram of the head and neck. Wall thickening in the urinary bladder, correlate with urine analysis to exclude cystitis. There is some borderline wall thickening in both ureters, cannot exclude mild ureteritis. Stomach/Bowel: Unremarkable Vascular/Lymphatic: Dense atheromatous vascular calcification of the abdominal aorta and its branches including heavy atheromatous plaque in the superior mesenteric artery plaque in the proximal celiac trunk. Patency not assessed on this noncontrast examination. Reproductive: Unremarkable Other: Trace ascites primarily in the paracolic gutters. Musculoskeletal: 1.1 cm anterolisthesis at L5-S1 with elimination of the intervertebral disc space and possible interbody fusion. Cannot exclude a left L5 pars defect. Loss of disc height and mild grade 1 degenerative retrolisthesis at L2-3. Degenerative hip arthropathy bilaterally. IMPRESSION: 1. Nasogastric tube tip is in the distal esophagus. If gastric placement is desired, advance 10 cm. 2. Scattered atelectasis in the lungs most notable in the lower lobes. There is some mild  mosaic attenuation in the right upper lobe possibly from air trapping. 3. Airway thickening is present, suggesting bronchitis or reactive airways disease. 4. Cholelithiasis. 5. Wall thickening in the urinary bladder, correlate with urine analysis to exclude cystitis. There is some borderline wall thickening in both ureters, cannot exclude mild ureteritis. 6. Trace ascites primarily in the paracolic gutters. 7. Moderate cardiomegaly. 8. 1.1 cm anterolisthesis at L5-S1 with possible interbody fusion. Cannot exclude a left L5 pars defect. Loss of disc height and mild grade 1 degenerative retrolisthesis at L2-3. 9. Degenerative hip arthropathy bilaterally. 10.  Aortic Atherosclerosis (ICD10-I70.0). Electronically Signed   By: Ryan Salvage M.D.   On: 12/27/2023 18:41   US  EKG SITE RITE Result Date: 12/27/2023 If Site Rite image not attached, placement could not be confirmed due to current cardiac rhythm.  DG CHEST PORT 1 VIEW Result Date: 12/27/2023 CLINICAL DATA:  Acute respiratory failure.  Code stroke. EXAM: PORTABLE CHEST 1 VIEW COMPARISON:  Earlier today. FINDINGS: Interval endotracheal tube in satisfactory position. Interval nasogastric tube with its tip in the distal esophagus. It is recommended that this be advanced at least 15 cm. Stable enlarged cardiac silhouette and clear lungs. Unremarkable bones. IMPRESSION: 1. Endotracheal tube in satisfactory position. 2. Nasogastric tube tip in the distal esophagus. It is recommended that this be advanced at least 15 cm. 3. Stable cardiomegaly. Electronically Signed   By: Elspeth Bathe M.D.   On: 12/27/2023 16:41   DG Chest Port 1 View Result Date: 12/27/2023 CLINICAL DATA:  Fever. EXAM: PORTABLE CHEST 1 VIEW COMPARISON:  None. FINDINGS: Enlarged cardiac silhouette. Clear lungs with normal vascularity. Unremarkable bones. IMPRESSION: Cardiomegaly.  No acute abnormality. Electronically Signed   By: Elspeth Bathe M.D.   On: 12/27/2023 16:39   CT ANGIO  HEAD  NECK W WO CM (CODE STROKE) Result Date: 12/27/2023 CLINICAL DATA:  Neuro deficit, concern for stroke, parenchymal hemorrhage. EXAM: CT ANGIOGRAPHY HEAD AND NECK WITH AND WITHOUT CONTRAST TECHNIQUE: Multidetector CT imaging of the head and neck was performed using the standard protocol during bolus administration of intravenous contrast. Multiplanar CT image reconstructions and MIPs were obtained to evaluate the vascular anatomy. Carotid stenosis measurements (when applicable) are obtained utilizing NASCET criteria, using the distal internal carotid diameter as the denominator. RADIATION DOSE REDUCTION: This exam was performed according to the departmental dose-optimization program which includes automated exposure control, adjustment of the mA and/or kV according to patient size and/or use of iterative reconstruction technique. CONTRAST:  75mL OMNIPAQUE  IOHEXOL  350 MG/ML SOLN COMPARISON:  Same-day head CT.  Carotid ultrasound 05/11/2017. FINDINGS: CTA NECK FINDINGS Aortic arch: Standard configuration of the aortic arch. Imaged portion shows no evidence of aneurysm or dissection. Mild atherosclerosis of the visualized aortic arch. No significant stenosis of the major arch vessel origins. Pulmonary arteries: As permitted by contrast timing, there are no filling defects in the visualized pulmonary arteries. Subclavian arteries: Patent bilaterally. Atherosclerosis along the proximal aspect of both subclavian arteries without significant stenosis. Right carotid system: Patent from the origin to the skull base. Motion artifact slightly limits evaluation of the vessel origin. Calcified and noncalcified atherosclerosis at the carotid bifurcation resulting in approximately 40% stenosis at the origin of the cervical ICA. Additional bulky calcified atherosclerosis along the proximal cervical ICA without significant stenosis. Left carotid system: Patent from the origin to the skull base. Mild atherosclerosis at the vessel  origin without stenosis. Additional calcified atherosclerosis at the carotid bifurcation resulting in approximately 40% stenosis at the origin of the cervical ICA. Tortuosity of the distal cervical ICA. Vertebral arteries: Codominant. Patent from the origins to the vertebrobasilar confluence. Atherosclerosis at the left vertebral artery origin resulting in severe stenosis. No significant stenosis at the origin of the right vertebral artery. No dissection. Skeleton: No acute findings. Degenerative changes in the cervical spine. Significant disc space narrowing at C4-5 through C6-7. Edentulous maxilla and mandible. Other neck: The visualized airway is patent. No cervical lymphadenopathy. Multiple thyroid nodules including a calcified left thyroid nodule measuring up to 2.1 cm. Upper chest: Visualized lung apices are clear. Review of the MIP images confirms the above findings CTA HEAD FINDINGS ANTERIOR CIRCULATION: The intracranial ICAs are patent bilaterally. Atherosclerosis throughout the carotid siphons. Mild-to-moderate stenosis of the bilateral paraclinoid ICAs. No severe stenosis, proximal occlusion, aneurysm, or vascular malformation. MCAs: Patent bilaterally. There is diminutive caliber of right MCA branches within the posterior aspect of the right MCA territory likely related to prior infarct. Irregularity and mild narrowing of the left M1 segment. There is irregularity and moderate multifocal narrowing of a proximal M2 branch of the left MCA. ACAs: The anterior cerebral arteries are patent bilaterally. POSTERIOR CIRCULATION: No significant stenosis, proximal occlusion, aneurysm, or vascular malformation. PCAs: The posterior cerebral arteries are patent bilaterally. Pcomm: Visualized on the right. SCAs: The superior cerebellar arteries are patent bilaterally. Basilar artery: Patent. Mild narrowing of the distal basilar artery. AICAs: Patent PICAs: Patent Vertebral arteries: The intracranial vertebral arteries  are patent. Venous sinuses: As permitted by contrast timing, patent. Anatomic variants: None Review of the MIP images confirms the above findings IMPRESSION: No large vessel occlusion. Atherosclerosis at the carotid bifurcations resulting in approximately 40% stenosis at the origins of both cervical ICAs. Severe stenosis at the origin of the left vertebral artery. Atherosclerosis of the carotid  siphons resulting in mild-to-moderate stenosis. Irregularity and mild stenosis of the left M1 segment. Additional moderate multifocal stenosis of a proximal M2 branch of the left MCA. Multiple thyroid nodules including a 2.1 cm calcified nodule. Recommend correlation with nonemergent thyroid ultrasound if not previously performed. Aortic Atherosclerosis (ICD10-I70.0). Electronically Signed   By: Donnice Mania M.D.   On: 12/27/2023 13:39   CT HEAD CODE STROKE WO CONTRAST Result Date: 12/27/2023 CLINICAL DATA:  Code stroke.  Neuro deficit, concern for stroke. EXAM: CT HEAD WITHOUT CONTRAST TECHNIQUE: Contiguous axial images were obtained from the base of the skull through the vertex without intravenous contrast. RADIATION DOSE REDUCTION: This exam was performed according to the departmental dose-optimization program which includes automated exposure control, adjustment of the mA and/or kV according to patient size and/or use of iterative reconstruction technique. COMPARISON:  MRI head 04/01/2023, CT head 12/12/2023. FINDINGS: Brain: 2.1 x 1.4 x 1.6 cm parenchymal hematoma in the posterior left frontal lobe likely involving the precentral gyrus with surrounding edema and mild local mass effect. No midline shift. Redemonstrated encephalomalacia in the right parietal lobe extending into the posterior aspect of the right superior temporal gyrus compatible with remote infarct. Nonspecific hypoattenuation in the periventricular and subcortical white matter favored to reflect chronic microvascular ischemic changes. Generalized  parenchymal volume loss. The basilar cisterns are patent. Ventricles: Prominence of the ventricles suggesting underlying parenchymal volume loss. Vascular: Atherosclerotic calcifications of the carotid siphons. No hyperdense vessel. Skull: No acute or aggressive finding. Orbits: Right lens replacement.  Orbits otherwise unremarkable. Sinuses: The visualized paranasal sinuses are clear. Other: Mastoid air cells are clear. ASPECTS (Alberta Stroke Program Early CT Score) - Ganglionic level infarction (caudate, lentiform nuclei, internal capsule, insula, M1-M3 cortex): 7 - Supraganglionic infarction (M4-M6 cortex): 3 Total score (0-10 with 10 being normal): 10 IMPRESSION: 1. 2.1 cm parenchymal hematoma in the posterior left frontal lobe with mild surrounding edema and local mass effect. No midline shift. An underlying hemorrhagic lesion cannot be excluded although this is considered less likely given the normal appearance of this region on MRI from September 2024. Consider repeat MRI head with and without contrast after resolution of acute episode. 2. Remote infarct in the right parietal temporal lobes. 3. Mild chronic microvascular ischemic changes and generalized parenchymal volume loss. 4. ASPECTS is 10 Finding of left frontal hematoma communicated to Dr. Vanessa at 1:15 pm on 12/27/2023 by text page via the [facility] messaging system. Electronically Signed   By: Donnice Mania M.D.   On: 12/27/2023 13:15    Microbiology Recent Results (from the past 240 hours)  Blood culture (routine x 2)     Status: Abnormal   Collection Time: 12/27/23  1:17 PM   Specimen: BLOOD  Result Value Ref Range Status   Specimen Description BLOOD RIGHT ANTECUBITAL  Final   Special Requests   Final    BOTTLES DRAWN AEROBIC AND ANAEROBIC Blood Culture adequate volume   Culture  Setup Time   Final    GRAM POSITIVE COCCI IN CLUSTERS IN BOTH AEROBIC AND ANAEROBIC BOTTLES CRITICAL RESULT CALLED TO, READ BACK BY AND VERIFIED WITH:  MAYA ALONSO DAUPHIN [phone] FCP Performed at [facility], [address], [place], [place] [address]    Culture STAPHYLOCOCCUS AUREUS (A)  Final   Report Status 12/21/2023 FINAL  Final   Organism ID, Bacteria STAPHYLOCOCCUS AUREUS  Final      Susceptibility   Staphylococcus aureus - MIC*    CIPROFLOXACIN >=8 RESISTANT Resistant  ERYTHROMYCIN <=0.25 SENSITIVE Sensitive     GENTAMICIN <=0.5 SENSITIVE Sensitive     OXACILLIN 0.5 SENSITIVE Sensitive     TETRACYCLINE <=1 SENSITIVE Sensitive     VANCOMYCIN  1 SENSITIVE Sensitive     TRIMETH/SULFA <=10 SENSITIVE Sensitive     CLINDAMYCIN <=0.25 SENSITIVE Sensitive     RIFAMPIN <=0.5 SENSITIVE Sensitive     Inducible Clindamycin NEGATIVE Sensitive     LINEZOLID 2 SENSITIVE Sensitive     * STAPHYLOCOCCUS AUREUS  Blood Culture ID Panel (Reflexed)     Status: Abnormal   Collection Time: 12/27/23  1:17 PM  Result Value Ref Range Status   Enterococcus faecalis NOT DETECTED NOT DETECTED Final   Enterococcus Faecium NOT DETECTED NOT DETECTED Final   Listeria monocytogenes NOT DETECTED NOT DETECTED Final   Staphylococcus species DETECTED (A) NOT DETECTED Final    Comment: CRITICAL RESULT CALLED TO, READ BACK BY AND VERIFIED WITH: PHARMD VSABRA DAUPHIN [phone] FCP    Staphylococcus aureus (BCID) DETECTED (A) NOT DETECTED Final    Comment: CRITICAL RESULT CALLED TO, READ BACK BY AND VERIFIED WITH: PHARMD VSABRA DAUPHIN [phone] FCP    Staphylococcus epidermidis NOT DETECTED NOT DETECTED Final   Staphylococcus lugdunensis NOT DETECTED NOT DETECTED Final   Streptococcus species NOT DETECTED NOT DETECTED Final   Streptococcus agalactiae NOT DETECTED NOT DETECTED Final   Streptococcus pneumoniae NOT DETECTED NOT DETECTED Final   Streptococcus pyogenes NOT DETECTED NOT DETECTED Final   A.calcoaceticus-baumannii NOT DETECTED NOT DETECTED Final   Bacteroides fragilis NOT DETECTED NOT DETECTED Final   Enterobacterales NOT DETECTED NOT DETECTED Final    Enterobacter cloacae complex NOT DETECTED NOT DETECTED Final   Escherichia coli NOT DETECTED NOT DETECTED Final   Klebsiella aerogenes NOT DETECTED NOT DETECTED Final   Klebsiella oxytoca NOT DETECTED NOT DETECTED Final   Klebsiella pneumoniae NOT DETECTED NOT DETECTED Final   Proteus species NOT DETECTED NOT DETECTED Final   Salmonella species NOT DETECTED NOT DETECTED Final   Serratia marcescens NOT DETECTED NOT DETECTED Final   Haemophilus influenzae NOT DETECTED NOT DETECTED Final   Neisseria meningitidis NOT DETECTED NOT DETECTED Final   Pseudomonas aeruginosa NOT DETECTED NOT DETECTED Final   Stenotrophomonas maltophilia NOT DETECTED NOT DETECTED Final   Candida albicans NOT DETECTED NOT DETECTED Final   Candida auris NOT DETECTED NOT DETECTED Final   Candida glabrata NOT DETECTED NOT DETECTED Final   Candida krusei NOT DETECTED NOT DETECTED Final   Candida parapsilosis NOT DETECTED NOT DETECTED Final   Candida tropicalis NOT DETECTED NOT DETECTED Final   Cryptococcus neoformans/gattii NOT DETECTED NOT DETECTED Final   Meth resistant mecA/C and MREJ NOT DETECTED NOT DETECTED Final    Comment: Performed at [facility], [address], [place], [place] [address]  Resp panel by RT-PCR (RSV, Flu A&B, Covid) Anterior Nasal Swab     Status: None   Collection Time: 12/27/23  1:21 PM   Specimen: Anterior Nasal Swab  Result Value Ref Range Status   SARS Coronavirus 2 by RT PCR NEGATIVE NEGATIVE Final   Influenza A by PCR NEGATIVE NEGATIVE Final   Influenza B by PCR NEGATIVE NEGATIVE Final    Comment: (NOTE) The Xpert Xpress SARS-CoV-2/FLU/RSV plus assay is intended as an aid in the diagnosis of influenza from Nasopharyngeal swab specimens and should not be used as a sole basis for treatment. Nasal washings and aspirates are unacceptable for Xpert Xpress SARS-CoV-2/FLU/RSV testing.  Fact Sheet for Patients: BloggerCourse.com  Fact  Sheet for  Healthcare Providers: SeriousBroker.it  This test is not yet approved or cleared by the [place]  FDA and has been authorized for detection and/or diagnosis of SARS-CoV-2 by FDA under an Emergency Use Authorization (EUA). This EUA will remain in effect (meaning this test can be used) for the duration of the COVID-19 declaration under Section 564(b)(1) of the Act, 21 U.S.C. section 360bbb-3(b)(1), unless the authorization is terminated or revoked.     Resp Syncytial Virus by PCR NEGATIVE NEGATIVE Final    Comment: (NOTE) Fact Sheet for Patients: BloggerCourse.com  Fact Sheet for Healthcare Providers: SeriousBroker.it  This test is not yet approved or cleared by the [place]  FDA and has been authorized for detection and/or diagnosis of SARS-CoV-2 by FDA under an Emergency Use Authorization (EUA). This EUA will remain in effect (meaning this test can be used) for the duration of the COVID-19 declaration under Section 564(b)(1) of the Act, 21 U.S.C. section 360bbb-3(b)(1), unless the authorization is terminated or revoked.  Performed at [facility], [address] [place], [place] [address]   Blood culture (routine x 2)     Status: Abnormal   Collection Time: 12/27/23  1:22 PM   Specimen: BLOOD RIGHT FOREARM  Result Value Ref Range Status   Specimen Description BLOOD RIGHT FOREARM  Final   Special Requests   Final    BOTTLES DRAWN AEROBIC AND ANAEROBIC Blood Culture results may not be optimal due to an inadequate volume of blood received in culture bottles   Culture  Setup Time   Final    GRAM POSITIVE COCCI IN CLUSTERS IN BOTH AEROBIC AND ANAEROBIC BOTTLES    Culture (A)  Final    STAPHYLOCOCCUS AUREUS SUSCEPTIBILITIES PERFORMED ON PREVIOUS CULTURE WITHIN THE LAST 5 DAYS. Performed at [facility], [address], [place], [place] [address]    Report Status 01/03/2024 FINAL   Final  MRSA Next Gen by PCR, Nasal     Status: Abnormal   Collection Time: 12/27/23  9:18 PM   Specimen: Nasal Mucosa; Nasal Swab  Result Value Ref Range Status   MRSA by PCR Next Gen DETECTED (A) NOT DETECTED Final    Comment: RESULT CALLED TO, READ BACK BY AND VERIFIED WITH: RN D. LORRI [phone] FCP (NOTE) The GeneXpert MRSA Assay (FDA approved for NASAL specimens only), is one component of a comprehensive MRSA colonization surveillance program. It is not intended to diagnose MRSA infection nor to guide or monitor treatment for MRSA infections. Test performance is not FDA approved in patients less than 52 years old. Performed at [facility], [address] [place], [place] [address]   Culture, Respiratory w Gram Stain     Status: None   Collection Time: 12/28/23 11:32 AM   Specimen: Tracheal Aspirate; Respiratory  Result Value Ref Range Status   Specimen Description TRACHEAL ASPIRATE  Final   Special Requests NONE  Final   Gram Stain   Final    MODERATE WBC PRESENT, PREDOMINANTLY PMN RARE SQUAMOUS EPITHELIAL CELLS PRESENT NO ORGANISMS SEEN    Culture   Final    RARE Normal respiratory flora-no Staph aureus or Pseudomonas seen Performed at [facility], [address], [place], [place] [address]    Report Status 12/31/2023 FINAL  Final  Culture, blood (Routine X 2) w Reflex to ID Panel     Status: None (Preliminary result)   Collection Time: 12/28/23 12:00 PM   Specimen: BLOOD LEFT HAND  Result Value Ref Range Status   Specimen Description BLOOD LEFT HAND  Final   Special Requests   Final    BOTTLES DRAWN AEROBIC AND ANAEROBIC Blood Culture results may not be optimal due to an inadequate volume of blood received in culture bottles   Culture   Final    NO GROWTH 3 DAYS Performed at [facility], [address], [place], [place] [address]    Report Status PENDING  Incomplete  Culture, blood (Routine X 2) w Reflex to ID Panel     Status: None  (Preliminary result)   Collection Time: 12/28/23 12:12 PM   Specimen: BLOOD RIGHT HAND  Result Value Ref Range Status   Specimen Description BLOOD RIGHT HAND  Final   Special Requests   Final    BOTTLES DRAWN AEROBIC AND ANAEROBIC Blood Culture results may not be optimal due to an inadequate volume of blood received in culture bottles   Culture   Final    NO GROWTH 3 DAYS Performed at [facility], [address] [place], [place] [address]    Report Status PENDING  Incomplete  Urine Culture     Status: Abnormal   Collection Time: 12/28/23  5:00 PM   Specimen: Urine, Random  Result Value Ref Range Status   Specimen Description URINE, RANDOM  Final   Special Requests URINE, RANDOM  Final   Culture (A)  Final    <10,000 COLONIES/mL INSIGNIFICANT GROWTH Performed at [facility], [address], [place], [place] [address]    Report Status 12/29/2023 FINAL  Final    Lab Basic Metabolic Panel: Recent Labs  Lab 12/27/23 1254 12/27/23 1258 12/27/23 1328 12/28/23 0552 12/28/23 1134 12/28/23 1200 12/29/23 0508 12/29/23 1332 12/29/23 1610 12/26/2023 0413 12/10/2023 0448  NA 127* 126*   < > 126* 125*  --  122* 125*  --  127* 125*  K 3.7 3.7   < > 3.2* 3.5  --  4.4 4.0  --  4.2 4.1  CL 94* 93*  --  97*  --   --  97* 94*  --  93*  --   CO2 16*  --   --  16*  --   --  12* 16*  --  18*  --   GLUCOSE 199* 201*  --  251*  --   --  403* 342*  --  286*  --   BUN 34* 35*  --  40*  --   --  63* 74*  --  97*  --   CREATININE 1.24* 1.20*  --  1.34*  --   --  2.33* 2.61*  --  3.18*  --   CALCIUM  8.4*  --   --  7.8*  --   --  7.5* 7.8*  --  7.7*  --   MG  --   --   --  1.9  --  1.8 2.3  --  2.2  --   --   PHOS  --   --   --  3.1  --  3.2 4.0  --  3.9  --   --    < > = values in this interval not displayed.   Liver Function Tests: Recent Labs  Lab 12/27/23 1254 12/29/23 0508  AST 66* 47*  ALT 27 28  ALKPHOS 53 79  BILITOT 2.8* 3.4*  PROT 6.0* 4.9*  ALBUMIN 2.0* <1.5*    No results for input(s): LIPASE,  AMYLASE in the last 168 hours. No results for input(s): AMMONIA in the last 168 hours. CBC: Recent Labs  Lab 12/27/23 1254 12/27/23 1258 12/28/23 0552 12/28/23 1134 12/29/23 0508 01/02/2024 0413 12/15/2023 0448  WBC 12.4*  --  18.9*  --  18.1* 23.3*  --   NEUTROABS 11.3*  --   --   --   --   --   --   HGB 11.9*   < > 10.0* 10.2* 10.5* 10.2* 10.9*  HCT 35.0*   < > 30.1* 30.0* 31.7* 30.5* 32.0*  MCV 83.7  --  85.5  --  85.0 83.3  --   PLT 117*  --  109*  --  115* 141*  --    < > = values in this interval not displayed.   Cardiac Enzymes: No results for input(s): CKTOTAL, CKMB, CKMBINDEX, TROPONINI in the last 168 hours. Sepsis Labs: Recent Labs  Lab 12/27/23 1254 12/27/23 1329 12/27/23 1448 12/27/23 2118 12/28/23 0552 12/28/23 1542 12/28/23 1842 12/29/23 0508 01/03/2024 0413  WBC 12.4*  --   --   --  18.9*  --   --  18.1* 23.3*  LATICACIDVEN  --    < > 7.6* 3.1*  --  2.7* 3.1*  --   --    < > = values in this interval not displayed.    Procedures/Operations  Intubation     Jorene Last 12/31/2023, 12:04 PM  ATTENDING NOTE: I reviewed above note and agree with the summary. I have made changes wherever appropriate.   Ary Cummins, MD PhD Stroke Neurology 01/01/2024 11:01 PM

## 2024-01-07 NOTE — Progress Notes (Signed)
 NAME:  Brittany Huffman, MRN:  [id number], DOB:  11-Apr-1951, LOS: 3 ADMISSION DATE:  12/27/2023, CONSULTATION DATE:  12/26/2021 REFERRING MD:  GORMAN Mari MD, CHIEF COMPLAINT:  AMS, Sepsis   History of Present Illness:  73 year old nursing home resident with history of MS, CVA, hypertension, diabetes, hyperlipidemia a fib on coumadin presenting with altered mental status, sepsis, new IPH and stroke.  Elevated lactic acid, INR noted.  Given IV fluids, Kcentra , vitamin K  in the ED. PCCM called for for help with management.   Pertinent Medical History:   Past Medical History:  Diagnosis Date   Diabetes mellitus without complication (HCC)    Pt takes insulin .   Hypertension    Multiple sclerosis (HCC)    Significant Hospital Events: Including procedures, antibiotic start and stop dates in addition to other pertinent events   6/20 - Admitted to ICU, intubated, CVL/A-line placed.  Interim History / Subjective:  No significant events overnight Uptrending vasopressor requirements, goal MAP adjusted to > 60 and SBP > 90 On Levo 8mcg, vaso 0.04. Mental status remains poor, off all sedation and minimally responsive Unable to wean on vent 2/2 tachypnea WBC continues to uptrend, renal function worsening TEE deferred for now in the setting of GOC conversations  Objective:    Blood pressure (!) 131/51, pulse (!) 56, temperature 99.7 F (37.6 C), temperature source Axillary, resp. rate (!) 23, height 5' 10 (1.778 m), weight 116.2 kg, SpO2 100%.    Vent Mode: PRVC FiO2 (%):  [40 %] 40 % Set Rate:  [16 bmp] 16 bmp Vt Set:  [550 mL] 550 mL PEEP:  [5 cmH20] 5 cmH20 Plateau Pressure:  [17 cmH20-23 cmH20] 18 cmH20   Intake/Output Summary (Last 24 hours) at 01/03/2024 1035 Last data filed at 12/22/2023 1000 Gross per 24 hour  Intake 3449.46 ml  Output 187 ml  Net 3262.46 ml   Filed Weights   12/27/23 1200 12/29/23 0500 12/08/2023 0500  Weight: 104 kg 111.2 kg 116.2 kg   Physical  Examination: General: Acute-on-chronically ill-appearing elderly woman in NAD. HEENT: [place]/AT, anicteric sclera, pupils unequal but reactive, L 5mm (sluggish), R 4mm brisk. Moist mucous membranes. Neuro: [place] responsive, intubated/not sedated. Does not respond to verbal, tactile or noxious stimuli. Not following commands. No spontaneous movement of extremities noted. +Cough (weak), corneals absent.  CV: Mildly bradycardic, regular rhythm. PULM: Breathing even and unlabored on vent (PEEP 5, FiO2 40%). Lung fields diminished at bilateral bases, some coarseness in upper fields. GI: Soft, nontender, nondistended. Normoactive bowel sounds. Extremities: Anasarca noted throughout, 2-3+ edema of BLE/BUE. Skin: Warm/dry, scattered ecchymosis of BUE, abdomen 2/2 heparin  injections.  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 CTA Head/Neck no LVO. MRI Brain 6/21 with numerous infratentorial/supratentorial acute infarcts with involvement of multiple vascular territories, c/f embolic source. Reversed Coumadin with KCentra  and Vit K, INR 1.3. - Stroke team primary - Goal SBP < 160 - Keppra  for seizure ppx - Frequent neuro checks - Neuroprotective measures: HOB > 30 degrees, normoglycemia, normothermia, electrolytes WNL - PT/OT/SLP when able to participate in care  Septic Shock, present on admission due to Staph Aureus Bacteremia, concern for UTI and aspiration pneumonia vs CNS infection Elevated lactic acid Concern for cystitis on CT. TTE negative for valvular vegetations -  Goal MAP > 60, SBP > 100 - Levophed  titrated to goal MAP + vaso - Trend WBC, fever curve - F/u Cx data - Continue broad-spectrum antibiotics (ceftriaxone /nafcillin ) - TEE  considered, after further discussion with family (daughter, son-in-law) at bedside 6/23, discussed likely futility as this may give us  some answers but will likely not change [name]'s clinical trajectory at this juncture; patient's family was in agreement with holding TEE  Acute hypoxemic respiratory failure - Continue full vent support (4-8cc/kg IBW) - Wean FiO2 for O2 sat > 90% - Daily WUA/SBT not appropriate at this time due to mental status - VAP bundle - Pulmonary hygiene - PAD protocol for sedation: Not on sedation at present, goal RASS 0 to -1  Afib HTN Concern for Hypertrophic Cardiomyopathy or Infiltrative Cardiomyopathy Based on Echo 6/21. - Cardiac monitoring - Optimize electrolytes for K > 4, Mg > 2 - Holding AC at present in the setting of IPH - Hold home antihypertensives in the setting of shock  AKI, baseline Cr 0.80 Non-anion gap metabolic acidosis Hypokalemia - Trend BMP - Replete electrolytes as indicated - Bicarb gtt - Monitor I&Os - Avoid nephrotoxic agents as able - Ensure adequate renal perfusion   Diabetes - Basal Semglee  10U - SSI (resistant scale) + TF coverage - CBGs Q4H - Goal CBG 140-180  GOC - Met with patient's daughter/SIL at bedside with Stroke team [name], NP) leading discussion - Patient remains DNR at present with plan to transition to comfort-focused care once arrangements have been made; daughter would like to speak to patient's brother and pastor prior to decision - Provided information on [facility] body donation program, as donation of her body to science was part of [name]'s wishes - No further escalation of care, will continue current interventions until family is ready for transition  Best Practice (right click and Reselect all SmartList Selections daily)   Diet/type: tubefeeds DVT prophylaxis SCD Pressure ulcer(s): N/A GI prophylaxis: PPI Lines: Central line and Arterial Line Foley:  Yes, and it is still needed Code Status:   DNR Last date of multidisciplinary goals of care discussion [Patient DNR now. Pending further care discussions based on clinical course. See above re: GOC discussions 6/23AM.]  Critical care time:   The patient is critically ill with multiple organ system failure and requires high complexity decision making for assessment and support, frequent evaluation and titration of therapies, advanced monitoring, review of radiographic studies and interpretation of complex data.   Critical Care Time devoted to patient care services, exclusive of separately billable procedures, described in this note is 39 minutes.  Corean CHRISTELLA Ceazia Harb, PA-C [facility] 12/22/2023 11:00 AM  Please see [email or web address] for pager details.  From 7A-7P if no response, please call [phone] After hours, please call ELink [phone]

## 2024-01-07 NOTE — Progress Notes (Signed)
 OT Cancellation Note  Patient Details Name: Brittany Huffman MRN: [id number] DOB: 05/01/1951   Cancelled Treatment:    Reason Eval/Treat Not Completed: Patient not medically ready;Other (comment) (Remains on vent/sedated; increased pressors. Signing off at this time. Please reorder when medically ready. Thank you.)  [facility] 01/02/2024, 6:08 AM Kreg Sink, OT/L   Acute OT Clinical Specialist Acute Rehabilitation Services Pager [phone] Office [phone]

## 2024-01-07 NOTE — IPAL (Signed)
  Interdisciplinary Goals of Care Family Meeting   Date carried out: 12/24/2023  Location of the meeting: Bedside  Member's involved: Nurse Practitioner, Bedside Registered Nurse, and Family Member or next of kin, CCM nurse practitioner  Durable Power of Attorney or acting medical decision maker: Daughter- Brittany Huffman    Discussion: We discussed goals of care for Brittany Huffman.  Initial conversation with family, neurology, and critical care regarding overnight events. Discussed decline in neurological status, concern for endocarditis, and continued multisystem organ failure. All questions answered. Family requested some time to update other family. Family requested another meeting as they have made the decision to transition to comfort care. All questions answered and orders place. Team members updated.   Code status:   Code Status: Do not attempt resuscitation (DNR) - Comfort care   Disposition: In-patient comfort care  Time spent for the meeting: 20 minutes    Brittany Last, NP  12/29/2023, 2:44 PM

## 2024-01-07 NOTE — Progress Notes (Signed)
 SLP Cancellation Note  Patient Details Name: Brittany Huffman MRN: [id number] DOB: 14-Feb-1951   Cancelled treatment:       Reason Eval/Treat Not Completed: Per chart review, pt transitioning to comfort measures. SLP will sign off at this time but please re-consult if acute needs arise.    Damien Blumenthal, M.A., CCC-SLP Speech Language Pathology, Acute Rehabilitation Services  Secure Chat preferred [phone]  12/11/2023, 2:49 PM

## 2024-01-07 NOTE — Progress Notes (Signed)
 [facility]  Date of Admission:  12/27/2023     Reason for Follow Up: ICH (intracerebral hemorrhage) (HCC)  Total days of antibiotics 4         ASSESSMENT:  Brittany Huffman blood cultures from 12/28/2023 remain without growth in the setting of MSSA bacteremia with likely septic emboli in left parietal intracranial hemorrhage.  Goals of care meeting planned for today.  Plan of care remains to continue current dose of nafcillin .  Therapeutic drug monitoring of renal function with acute kidney injury.  TTE without evidence of vegetation and holding on TEE pending additional goals of care.  Made DNR last night.  Remains on ventilator and vasopressor support.  May need line holiday pending goals of care.  Continue standard/universal precautions.  Remaining medical and supportive care per PCCM.  PLAN:  Continue current dose of nafcillin . Therapeutic drug monitoring of renal function in the setting of acute kidney injury. Monitor blood cultures for clearance of bacteremia. Goals of care meeting per palliative care team Continue current universal/standard precautions. Remaining medical and supportive care per PCCM.  Principal Problem:   ICH (intracerebral hemorrhage) (HCC) Active Problems:   Sepsis (HCC)   MSSA bacteremia   Acute kidney injury (HCC)    Chlorhexidine  Gluconate Cloth  6 each Topical Q0600   feeding supplement (PROSource TF20)  60 mL Per Tube QID   feeding supplement (VITAL 1.5 CAL)  1,000 mL Per Tube Q24H   heparin  injection (subcutaneous)  5,000 Units Subcutaneous Q8H   insulin  aspart  0-20 Units Subcutaneous Q4H   insulin  aspart  4 Units Subcutaneous Q4H   insulin  glargine-yfgn  10 Units Subcutaneous Daily   multivitamin with minerals  1 tablet Per Tube Daily   mupirocin  ointment  1 Application Nasal BID   mouth rinse  15 mL Mouth Rinse Q2H   pantoprazole  (PROTONIX ) IV  40 mg Intravenous Q24H   polyethylene glycol  17 g Per Tube Daily    senna-docusate  1 tablet Per Tube BID   sodium chloride   1 g Per Tube Q8H    SUBJECTIVE:  Febrile overnight with no acute events. Intubated. Increase in leukocytosis with most recent 23.3.   No Known Allergies   Review of Systems: Review of Systems  Unable to perform ROS: Intubated      OBJECTIVE: Vitals:   01/06/2024 0945 01/06/2024 1000 12/13/2023 1015 12/22/2023 1030  BP: (!) 127/51 (!) 123/53 (!) 127/57 (!) 131/51  Pulse: (!) 56 (!) 57 (!) 56 (!) 56  Resp: (!) 21 (!) 23 20 (!) 23  Temp:      TempSrc:      SpO2: 100% 100% 100% 100%  Weight:      Height:       Body mass index is 36.76 kg/m.  Physical Exam Constitutional:      General: She is not in acute distress.    Appearance: She is well-developed. She is ill-appearing.     Interventions: She is intubated.     Comments: Lying in bed with head of bed elevated   Cardiovascular:     Rate and Rhythm: Normal rate and regular rhythm.     Heart sounds: Normal heart sounds.     Comments: Central line present left IJ.  Arterial line present Pulmonary:     Effort: Pulmonary effort is normal. She is intubated.     Breath sounds: Normal breath sounds.   Skin:    General: Skin is warm and dry.  Neurological:     Mental Status: She is oriented to person, place, and time.   Psychiatric:        Behavior: Behavior normal.        Thought Content: Thought content normal.        Judgment: Judgment normal.     Lab Results Lab Results  Component Value Date   WBC 23.3 (H) 12/12/2023   HGB 10.9 (L) 01/05/2024   HCT 32.0 (L) 12/29/2023   MCV 83.3 12/14/2023   PLT 141 (L) 12/24/2023    Lab Results  Component Value Date   CREATININE 3.18 (H) 01/06/2024   BUN 97 (H) 12/18/2023   NA 125 (L) 12/13/2023   K 4.1 01/05/2024   CL 93 (L) 12/28/2023   CO2 18 (L) 12/11/2023    Lab Results  Component Value Date   ALT 28 12/29/2023   AST 47 (H) 12/29/2023   ALKPHOS 79 12/29/2023   BILITOT 3.4 (H) 12/29/2023      Microbiology: Recent Results (from the past 240 hours)  Blood culture (routine x 2)     Status: Abnormal   Collection Time: 12/27/23  1:17 PM   Specimen: BLOOD  Result Value Ref Range Status   Specimen Description BLOOD RIGHT ANTECUBITAL  Final   Special Requests   Final    BOTTLES DRAWN AEROBIC AND ANAEROBIC Blood Culture adequate volume   Culture  Setup Time   Final    GRAM POSITIVE COCCI IN CLUSTERS IN BOTH AEROBIC AND ANAEROBIC BOTTLES CRITICAL RESULT CALLED TO, READ BACK BY AND VERIFIED WITH: MAYA ALONSO DAUPHIN [phone] FCP Performed at [facility], [address] [place], [place] [address]    Culture STAPHYLOCOCCUS AUREUS (A)  Final   Report Status 01/05/2024 FINAL  Final   Organism ID, Bacteria STAPHYLOCOCCUS AUREUS  Final      Susceptibility   Staphylococcus aureus - MIC*    CIPROFLOXACIN >=8 RESISTANT Resistant     ERYTHROMYCIN <=0.25 SENSITIVE Sensitive     GENTAMICIN <=0.5 SENSITIVE Sensitive     OXACILLIN 0.5 SENSITIVE Sensitive     TETRACYCLINE <=1 SENSITIVE Sensitive     VANCOMYCIN  1 SENSITIVE Sensitive     TRIMETH/SULFA <=10 SENSITIVE Sensitive     CLINDAMYCIN <=0.25 SENSITIVE Sensitive     RIFAMPIN <=0.5 SENSITIVE Sensitive     Inducible Clindamycin NEGATIVE Sensitive     LINEZOLID 2 SENSITIVE Sensitive     * STAPHYLOCOCCUS AUREUS  Blood Culture ID Panel (Reflexed)     Status: Abnormal   Collection Time: 12/27/23  1:17 PM  Result Value Ref Range Status   Enterococcus faecalis NOT DETECTED NOT DETECTED Final   Enterococcus Faecium NOT DETECTED NOT DETECTED Final   Listeria monocytogenes NOT DETECTED NOT DETECTED Final   Staphylococcus species DETECTED (A) NOT DETECTED Final    Comment: CRITICAL RESULT CALLED TO, READ BACK BY AND VERIFIED WITH: PHARMD V. BRYK [phone] FCP    Staphylococcus aureus (BCID) DETECTED (A) NOT DETECTED Final    Comment: CRITICAL RESULT CALLED TO, READ BACK BY AND VERIFIED WITH: PHARMD VSABRA DAUPHIN [phone] FCP     Staphylococcus epidermidis NOT DETECTED NOT DETECTED Final   Staphylococcus lugdunensis NOT DETECTED NOT DETECTED Final   Streptococcus species NOT DETECTED NOT DETECTED Final   Streptococcus agalactiae NOT DETECTED NOT DETECTED Final   Streptococcus pneumoniae NOT DETECTED NOT DETECTED Final   Streptococcus pyogenes NOT DETECTED NOT DETECTED Final   A.calcoaceticus-baumannii NOT DETECTED NOT DETECTED Final  Bacteroides fragilis NOT DETECTED NOT DETECTED Final   Enterobacterales NOT DETECTED NOT DETECTED Final   Enterobacter cloacae complex NOT DETECTED NOT DETECTED Final   Escherichia coli NOT DETECTED NOT DETECTED Final   Klebsiella aerogenes NOT DETECTED NOT DETECTED Final   Klebsiella oxytoca NOT DETECTED NOT DETECTED Final   Klebsiella pneumoniae NOT DETECTED NOT DETECTED Final   Proteus species NOT DETECTED NOT DETECTED Final   Salmonella species NOT DETECTED NOT DETECTED Final   Serratia marcescens NOT DETECTED NOT DETECTED Final   Haemophilus influenzae NOT DETECTED NOT DETECTED Final   Neisseria meningitidis NOT DETECTED NOT DETECTED Final   Pseudomonas aeruginosa NOT DETECTED NOT DETECTED Final   Stenotrophomonas maltophilia NOT DETECTED NOT DETECTED Final   Candida albicans NOT DETECTED NOT DETECTED Final   Candida auris NOT DETECTED NOT DETECTED Final   Candida glabrata NOT DETECTED NOT DETECTED Final   Candida krusei NOT DETECTED NOT DETECTED Final   Candida parapsilosis NOT DETECTED NOT DETECTED Final   Candida tropicalis NOT DETECTED NOT DETECTED Final   Cryptococcus neoformans/gattii NOT DETECTED NOT DETECTED Final   Meth resistant mecA/C and MREJ NOT DETECTED NOT DETECTED Final    Comment: Performed at [facility], [address], [place], [place] [address]  Resp panel by RT-PCR (RSV, Flu A&B, Covid) Anterior Nasal Swab     Status: None   Collection Time: 12/27/23  1:21 PM   Specimen: Anterior Nasal Swab  Result Value Ref Range Status   SARS Coronavirus 2 by  RT PCR NEGATIVE NEGATIVE Final   Influenza A by PCR NEGATIVE NEGATIVE Final   Influenza B by PCR NEGATIVE NEGATIVE Final    Comment: (NOTE) The Xpert Xpress SARS-CoV-2/FLU/RSV plus assay is intended as an aid in the diagnosis of influenza from Nasopharyngeal swab specimens and should not be used as a sole basis for treatment. Nasal washings and aspirates are unacceptable for Xpert Xpress SARS-CoV-2/FLU/RSV testing.  Fact Sheet for Patients: BloggerCourse.com  Fact Sheet for Healthcare Providers: SeriousBroker.it  This test is not yet approved or cleared by the [place]  FDA and has been authorized for detection and/or diagnosis of SARS-CoV-2 by FDA under an Emergency Use Authorization (EUA). This EUA will remain in effect (meaning this test can be used) for the duration of the COVID-19 declaration under Section 564(b)(1) of the Act, 21 U.S.C. section 360bbb-3(b)(1), unless the authorization is terminated or revoked.     Resp Syncytial Virus by PCR NEGATIVE NEGATIVE Final    Comment: (NOTE) Fact Sheet for Patients: BloggerCourse.com  Fact Sheet for Healthcare Providers: SeriousBroker.it  This test is not yet approved or cleared by the [place]  FDA and has been authorized for detection and/or diagnosis of SARS-CoV-2 by FDA under an Emergency Use Authorization (EUA). This EUA will remain in effect (meaning this test can be used) for the duration of the COVID-19 declaration under Section 564(b)(1) of the Act, 21 U.S.C. section 360bbb-3(b)(1), unless the authorization is terminated or revoked.  Performed at [facility], [address], [place], [place] [address]   Blood culture (routine x 2)     Status: Abnormal   Collection Time: 12/27/23  1:22 PM   Specimen: BLOOD RIGHT FOREARM  Result Value Ref Range Status   Specimen Description BLOOD RIGHT FOREARM  Final    Special Requests   Final    BOTTLES DRAWN AEROBIC AND ANAEROBIC Blood Culture results may not be optimal due to an inadequate volume of blood received in culture bottles  Culture  Setup Time   Final    GRAM POSITIVE COCCI IN CLUSTERS IN BOTH AEROBIC AND ANAEROBIC BOTTLES    Culture (A)  Final    STAPHYLOCOCCUS AUREUS SUSCEPTIBILITIES PERFORMED ON PREVIOUS CULTURE WITHIN THE LAST 5 DAYS. Performed at [facility], [address] [place], [place] [address]    Report Status 01/02/2024 FINAL  Final  MRSA Next Gen by PCR, Nasal     Status: Abnormal   Collection Time: 12/27/23  9:18 PM   Specimen: Nasal Mucosa; Nasal Swab  Result Value Ref Range Status   MRSA by PCR Next Gen DETECTED (A) NOT DETECTED Final    Comment: RESULT CALLED TO, READ BACK BY AND VERIFIED WITH: RN D. LORRI [phone] FCP (NOTE) The GeneXpert MRSA Assay (FDA approved for NASAL specimens only), is one component of a comprehensive MRSA colonization surveillance program. It is not intended to diagnose MRSA infection nor to guide or monitor treatment for MRSA infections. Test performance is not FDA approved in patients less than 17 years old. Performed at [facility], [address], [place], [place] [address]   Culture, Respiratory w Gram Stain     Status: None (Preliminary result)   Collection Time: 12/28/23 11:32 AM   Specimen: Tracheal Aspirate; Respiratory  Result Value Ref Range Status   Specimen Description TRACHEAL ASPIRATE  Final   Special Requests NONE  Final   Gram Stain   Final    MODERATE WBC PRESENT, PREDOMINANTLY PMN RARE SQUAMOUS EPITHELIAL CELLS PRESENT NO ORGANISMS SEEN    Culture   Final    CULTURE REINCUBATED FOR BETTER GROWTH Performed at [facility], [address], [place], [place] [address]    Report Status PENDING  Incomplete  Culture, blood (Routine X 2) w Reflex to ID Panel     Status: None (Preliminary result)   Collection Time: 12/28/23 12:00 PM    Specimen: BLOOD LEFT HAND  Result Value Ref Range Status   Specimen Description BLOOD LEFT HAND  Final   Special Requests   Final    BOTTLES DRAWN AEROBIC AND ANAEROBIC Blood Culture results may not be optimal due to an inadequate volume of blood received in culture bottles   Culture   Final    NO GROWTH 2 DAYS Performed at [facility], [address], [place], [place] [address]    Report Status PENDING  Incomplete  Culture, blood (Routine X 2) w Reflex to ID Panel     Status: None (Preliminary result)   Collection Time: 12/28/23 12:12 PM   Specimen: BLOOD RIGHT HAND  Result Value Ref Range Status   Specimen Description BLOOD RIGHT HAND  Final   Special Requests   Final    BOTTLES DRAWN AEROBIC AND ANAEROBIC Blood Culture results may not be optimal due to an inadequate volume of blood received in culture bottles   Culture   Final    NO GROWTH 2 DAYS Performed at [facility], [address], [place], [place] [address]    Report Status PENDING  Incomplete  Urine Culture     Status: Abnormal   Collection Time: 12/28/23  5:00 PM   Specimen: Urine, Random  Result Value Ref Range Status   Specimen Description URINE, RANDOM  Final   Special Requests URINE, RANDOM  Final   Culture (A)  Final    <10,000 COLONIES/mL INSIGNIFICANT GROWTH Performed at [facility], [address] [place], [place]  [address]    Report Status 12/29/2023 FINAL  Final    I have personally spent 27 minutes involved in face-to-face and non-face-to-face activities for this patient on the day of the visit. Professional time spent includes the following activities: preparing to see the patient (review of tests), performing a medically appropriate examination, ordering medications/tests/procedures, communicating with other health care professionals, documenting clinical information in the EMR, and care coordination (not separately reported).    Greg Nesta Scaturro, NP [facility]  12/18/2023  11:58 AM

## 2024-01-07 NOTE — Progress Notes (Signed)
 0408: notified [facility] of increased Levo requirements over the past few hours.  Since about 0100 until now, Levo titrated from 5 to 19.  MAP still remaining below 65 despite SBP being in the 140s/150s.  There has been no additional medications given to account for this increase in requirement.     [id number] Kerns MD conversation face to face via camera.  Plan is to get morning ABG and start pt on Vasopressin to allow down titration of Levo gtt.

## 2024-01-07 NOTE — Progress Notes (Signed)
 Nutrition Brief Note  Chart reviewed. Discussed with RN. Pt now transitioning to comfort care.   No further nutrition interventions planned at this time. Please re-consult as needed.    Nestora Glatter RD, LDN Clinical Dietitian

## 2024-01-07 NOTE — Progress Notes (Addendum)
 Called [facility] to update them concerning patients recent plan of care change. (Family wishes to withdraw life support interventions, see notes from [place] on 12/14/2023.)  [facility] requests a call with patients TOD or time of extubation.   [facility] updated with patients TOD and time of extubation. Patient is a candidate for tissue and eye donation. Will prepare eyes for donation.  12/22/2023 Charletta Bathe, RN, BSN 1:13 PM

## 2024-01-07 NOTE — Progress Notes (Signed)
 PT Cancellation Note  Patient Details Name: Brittany Huffman MRN: [id number] DOB: [birth year]   Cancelled Treatment:    Reason Eval/Treat Not Completed: Medical issues which prohibited therapy;Patient not medically ready - Remains intubated, sedated, increased pressors. PT to sign off at this time, please reconsult as medically appropriate.   Favor Hackler S, PT DPT Acute Rehabilitation Services Secure Chat Preferred  Office [phone]    Brittany Huffman 01/02/2024, 8:34 AM

## 2024-01-07 NOTE — Progress Notes (Addendum)
 STROKE TEAM PROGRESS NOTE    SIGNIFICANT HOSPITAL EVENTS  6/20: Presented from SNF d/t R gaze, sleepiness. 102 fever, elevated lactate, positive UA.   Last home coumadin dose 1700 6/19 CT Head with small L parietal ICH. CTA with no spot sign.   Blood cultures positive for MSSA bacteremia 6/21: MRI: Numerous infratentorial and supratentorial acute infarcts.   INTERIM HISTORY/SUBJECTIVE Intubated. No sedation.  Does not open eyes, does not follow commands not blinking to threat, doll's eyes absent, not tracking.  Absent corneal reflex, present cough and gag reflex.  She is breathing over the vent with vent dyssynchrony.  Withdraws BLE minimally Made DNR yesterday. Cardiology holding TEE. Kidney function worsening today.   OBJECTIVE  CBC    Component Value Date/Time   WBC 23.3 (H) 12/27/2023 0413   RBC 3.66 (L) 12/26/2023 0413   HGB 10.9 (L) 01/01/2024 0448   HCT 32.0 (L) 12/29/2023 0448   PLT 141 (L) 12/25/2023 0413   MCV 83.3 01/01/2024 0413   MCH 27.9 01/03/2024 0413   MCHC 33.4 01/04/2024 0413   RDW 15.9 (H) 12/25/2023 0413   LYMPHSABS 0.1 (L) 12/27/2023 1254   MONOABS 0.7 12/27/2023 1254   EOSABS 0.0 12/27/2023 1254   BASOSABS 0.0 12/27/2023 1254    BMET    Component Value Date/Time   NA 125 (L) 12/16/2023 0448   K 4.1 12/20/2023 0448   CL 93 (L) 12/16/2023 0413   CO2 18 (L) 01/05/2024 0413   GLUCOSE 286 (H) 01/01/2024 0413   BUN 97 (H) 01/03/2024 0413   CREATININE 3.18 (H) 12/31/2023 0413   CALCIUM  7.7 (L) 12/24/2023 0413   GFRNONAA 15 (L) 12/10/2023 0413    IMAGING past 24 hours No results found.   Vitals:   12/12/2023 0900 01/04/2024 0915 01/05/2024 0930 12/20/2023 0945  BP: (!) 131/49 (!) 133/52 129/66 (!) 127/51  Pulse: (!) 56 (!) 56 (!) 56 (!) 56  Resp: (!) 23 (!) 22 (!) 26 (!) 21  Temp:      TempSrc:      SpO2: 100% 100% 100% 100%  Weight:      Height:        PHYSICAL EXAM General: chronically and acutely ill  CV: Irregularly irregular heart rate  and rhythm on monitor Respiratory:  Intubated, mechanically ventilated on full support. Labored abdominal breathing with vent dyssynchrony.   NEURO:  Intubated just off sedation.  Patient's eyes are closed, she does not open to voice or noxious stimuli.  She does not follow commands.  Eyes are midline with bilateral pupils reactive.  Left pupil is larger than right pupil. Left gaze preference. Absent corneal reflex, present cough and gag reflex.  Labored breathing over the vent with vent dyssynchrony Unable to test facial symmetry due to ETT tube present. Mildly withdraws BLE symmetrically.  No movement of BUE seen spontaneous or to command or in response to noxious stimuli. Sensation coordination and gait not tested  Most Recent NIH 37    ASSESSMENT/PLAN  Ms. Danyale Ridinger is a 73 y.o. female with hx of DM2, HTN, MS, bedbound in a SNF at baseline, Afib on warfarin who was BIB EMS after being found by staff at her facility to be sleepy with R gaze. Per notes, patient was found in the am but EMS was ot called until the afternoon.  On arrival, aphasic, weak all over but more so on the right than left and gaze deviation to the right. CT Head with L parietal small ICH.Labs with  INR of 4, elevated lactate. Facility [facility] reviewed and shows that patient was given 3mg  of warfarin at 1700 6/20.  NIH on Admission 26.  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  No midline shift. Remote infarct right parietal temporal lobes, Aspects 10 CTA head & neck - No LVO, approximate 40% stenosis at origins of both cervical ICAs. Severe stenosis origin of left vertebral artery. Irregularity and mild stenosis left M1.  Moderate multifocal stenosis proximal left M2 MRI w/wo Numerous infratentorial and supratentorial acute infarcts. Numerous  punctate foci of susceptibility artifacts which may represent micro emboli versus chronic microhemorrhages. ?amyloid angiopathy 2D Echo: EF greater than 75%, severe concentric LVH, severely dilated left atria Initially recommend TEE for further evaluation for endocarditis, but need to have further GOC discussion first given poor prognosis LTM EEG moderate to severe diffuse encephalopathy. No seizures or epileptiform discharges -> off  LDL no able to calculate due to low HLD TG 254, HDL < 10 A1c 7.6 VTE prophylaxis - SCDs warfarin daily prior to admission, now on No antithrombotic due to ICH Therapy recommendations:  Pending Disposition: had family discussion 6/22, now DNR, will continue GOC discussion today  Acute respiratory failure ?Aspiration Pneumonia CCM management, appreciate assistance Mental status is currently barrier to extubation On vent but off sedation  Sepsis, Septic Shock MSSA bacteremia UTI  ? Endocarditis ? Meningitis  Tmax 102--100.5-afebrile Leukocytosis WBC 12.4--18.9-18.1 -23.3 LA: 5.2-5.4-7.6-3.1 UA WBC > 50, likely source MSSA present on B Cx Not a candidate for LP due to IPH No vegetation on TTE TEE pending for further GOC discussion Requiring pressor support, goal MAP >65 ID on board, was on broad antibiotics -> nafcillin  and Rocephin   Atrial fibrillation Home Meds: Warfarin Reversed with Kcentra  INR 4.0->1.6->1.3 Continue telemetry monitoring Hold anticoagulation due to ICH  Hx of Hypertension Now with Hypotension Unstable Now requiring use of pressor support - levophed  BP goal < 160  Hyperlipidemia Home meds:  none TG 254, HDL < 10 LDL not able to calculate due to low HDL, goal < 70  Diabetes type II Uncontrolled Home meds: Humalog  HgbA1c 7.6, goal < 7.0 CBGs SSI Recommend close follow-up with PCP for better DM control  Dysphagia Intubated Continue tube feeds, Vital 1.5 @ 35 ml/hr  AKI, worsening creatinine 1.24--1.34--2.33 ->  3.18 ? Renal septic emboli On bicarb CCM managing, appreciate help  Other Stroke Risk Factors Obesity, Body mass index is 36.76 kg/m., BMI >/= 30 associated with increased stroke risk, recommend weight loss, diet and exercise as appropriate  Advanced age  Other Active Problems Hx of MS - bedbound at SNF Hyponatremia, chronic, Na 126--127--125--122-125 Now on NaHCO3 gtt and Na tabs Q6h Elevated LFTs, AST/ALT 66/27 [facility] day # 3   Patient seen and examined by NP/APP with MD. MD to update note as needed.   Jorene Last, DNP, FNP-BC [facility] Pager: [phone]  ATTENDING NOTE: I reviewed above note and agree with the assessment and plan. Pt was seen and examined.   No family at the bedside. Pt unresponsive, on vent, left pupil still mildly larger than right, but reactive to light, no corneals today but gag present. No movement on pain in all extremities. Pt renal function worsening and less responsive than before, will need further GOC discussion with family today.   For detailed assessment and  plan, please refer to above as I have made changes wherever appropriate.   Ary Cummins, MD PhD Stroke Neurology 12/23/2023 4:31 PM  This patient is critically ill due to ICH, endocarditis, embolic strokes, fever, respiratory failure and at significant risk of neurological worsening, death form sepsis, septic shock, mycotic aneurysm rupture, stroke progression, heart failure. This patient's care requires constant monitoring of vital signs, hemodynamics, respiratory and cardiac monitoring, review of multiple databases, neurological assessment, discussion with family, other specialists and medical decision making of high complexity. I spent 30 minutes of neurocritical care time in the care of this patient. I discussed with CCM PA.

## 2024-01-07 NOTE — TOC CAGE-AID Note (Signed)
 Transition of Care [facility]) - CAGE-AID Screening   Patient Details  Name: Brittany Huffman MRN: [id number] Date of Birth: June 07, 1951  Transition of Care [facility] CM/SW Contact:    Rochelle Larue E Vasil Juhasz, LCSW Phone Number: 12/21/2023, 9:22 AM   Clinical Narrative: Currently intubated.   CAGE-AID Screening: Substance Abuse Screening unable to be completed due to: : Patient unable to participate

## 2024-01-07 NOTE — Progress Notes (Signed)
 Nutrition Brief Note  Chart reviewed. Discussed with RN, TF held overnight. Pt now transitioning to comfort care.   No further nutrition interventions planned at this time. Please re-consult as needed.    Nestora Glatter RD, LDN Clinical Dietitian

## 2024-01-07 NOTE — Plan of Care (Signed)
 Weaning trials from ventilator going poorly. Patient becomes tachypneic once flipped over from full support.  Problem: Respiratory: Goal: Ability to maintain a clear airway and adequate ventilation will improve 12/23/2023 1036 by Robynn Holms, RN Outcome: Not Progressing 12/17/2023 1035 by Robynn Holms, RN Outcome: Not Progressing

## 2024-01-07 NOTE — Procedures (Signed)
 Extubation Procedure Note  Patient Details:   Name: Brittany Huffman DOB: 1951/03/01 MRN: [id number]   Airway Documentation:    Vent end date: 12/14/2023 Vent end time: 1503   Evaluation  Patient extubated per order to comfort care measures. RN and family at bedside.  [name] 12/09/2023, 3:03 PM

## 2024-01-07 DEATH — deceased
